# Patient Record
Sex: Female | Born: 1970 | Race: White | Hispanic: No | Marital: Married | Smoking: Never smoker
Health system: Southern US, Community
[De-identification: ages and names within clinical notes are randomized; demographics above are authoritative.]

## PROBLEM LIST (undated history)

## (undated) DIAGNOSIS — T7840XA Allergy, unspecified, initial encounter: Secondary | ICD-10-CM

## (undated) DIAGNOSIS — F419 Anxiety disorder, unspecified: Secondary | ICD-10-CM

## (undated) DIAGNOSIS — Z5189 Encounter for other specified aftercare: Secondary | ICD-10-CM

## (undated) DIAGNOSIS — E785 Hyperlipidemia, unspecified: Secondary | ICD-10-CM

## (undated) HISTORY — PX: TONSILLECTOMY: SUR1361

## (undated) HISTORY — DX: Hyperlipidemia, unspecified: E78.5

## (undated) HISTORY — DX: Allergy, unspecified, initial encounter: T78.40XA

## (undated) HISTORY — DX: Encounter for other specified aftercare: Z51.89

## (undated) HISTORY — DX: Anxiety disorder, unspecified: F41.9

## (undated) HISTORY — PX: EYE SURGERY: SHX253

---
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
## 2006-04-25 HISTORY — PX: CHOLECYSTECTOMY: SHX55

## 2014-01-23 LAB — HM MAMMOGRAPHY

## 2014-01-23 LAB — HM PAP SMEAR

## 2014-04-22 ENCOUNTER — Encounter: Payer: Self-pay | Admitting: Family Medicine

## 2014-04-22 DIAGNOSIS — T7840XA Allergy, unspecified, initial encounter: Secondary | ICD-10-CM | POA: Insufficient documentation

## 2014-04-22 DIAGNOSIS — E785 Hyperlipidemia, unspecified: Secondary | ICD-10-CM | POA: Insufficient documentation

## 2014-05-05 ENCOUNTER — Ambulatory Visit (INDEPENDENT_AMBULATORY_CARE_PROVIDER_SITE_OTHER): Payer: 59 | Admitting: Physician Assistant

## 2014-05-05 ENCOUNTER — Encounter: Payer: Self-pay | Admitting: Family Medicine

## 2014-05-05 ENCOUNTER — Encounter: Payer: Self-pay | Admitting: Physician Assistant

## 2014-05-05 VITALS — BP 122/80 | HR 76 | Temp 98.2°F | Resp 18 | Ht 65.5 in | Wt 165.0 lb

## 2014-05-05 DIAGNOSIS — Z8249 Family history of ischemic heart disease and other diseases of the circulatory system: Secondary | ICD-10-CM

## 2014-05-05 DIAGNOSIS — J309 Allergic rhinitis, unspecified: Secondary | ICD-10-CM | POA: Insufficient documentation

## 2014-05-05 DIAGNOSIS — E785 Hyperlipidemia, unspecified: Secondary | ICD-10-CM

## 2014-05-05 DIAGNOSIS — E559 Vitamin D deficiency, unspecified: Secondary | ICD-10-CM

## 2014-05-05 DIAGNOSIS — J301 Allergic rhinitis due to pollen: Secondary | ICD-10-CM

## 2014-05-05 DIAGNOSIS — Z0000 Encounter for general adult medical examination without abnormal findings: Secondary | ICD-10-CM
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
## 2014-06-16 NOTE — Progress Notes (Signed)
Patient was seen here for complete physical exam 05/05/14. She was to return fasting for full panel of labs. Full panel of labs placed as future order and are now appearing in Overdue Results folder.  Please call patient and remind her to come fasting for labs ASAP-- if she waits longer, her insurance may not cover this as part of her physical..

## 2015-05-08 MED FILL — VIORELE 28 DAY TABLET: 0.15-0.02/0 | 28 days supply | Qty: 28 | Fill #0

## 2015-05-11 DIAGNOSIS — Z01419 Encounter for gynecological examination (general) (routine) without abnormal findings: Secondary | ICD-10-CM | POA: Diagnosis not present

## 2015-05-11 DIAGNOSIS — Z124 Encounter for screening for malignant neoplasm of cervix: Secondary | ICD-10-CM | POA: Diagnosis not present

## 2015-05-11 DIAGNOSIS — Z6828 Body mass index (BMI) 28.0-28.9, adult: Secondary | ICD-10-CM | POA: Diagnosis not present

## 2015-05-11 DIAGNOSIS — Z1231 Encounter for screening mammogram for malignant neoplasm of breast: Secondary | ICD-10-CM | POA: Diagnosis not present

## 2015-05-11 LAB — HM MAMMOGRAPHY

## 2015-05-11 LAB — HM PAP SMEAR

## 2015-05-13 ENCOUNTER — Encounter: Payer: Self-pay | Admitting: Family Medicine

## 2015-06-05 DIAGNOSIS — J019 Acute sinusitis, unspecified: Secondary | ICD-10-CM | POA: Diagnosis not present

## 2015-06-12 MED FILL — VIORELE 28 DAY TABLET: 0.15-0.02/0 | 84 days supply | Qty: 84 | Fill #0

## 2015-09-08 MED FILL — VIORELE 28 DAY TABLET: 0.15-0.02/0 | 84 days supply | Qty: 84 | Fill #1

## 2015-11-25 MED FILL — VIORELE 28 DAY TABLET: 0.15-0.02/0 | 84 days supply | Qty: 84 | Fill #2

## 2016-02-17 MED FILL — VIORELE 28 DAY TABLET: 0.15-0.02/0 | 84 days supply | Qty: 84 | Fill #3

## 2016-05-10 MED FILL — VIORELE 28 DAY TABLET: 0.15-0.02/0 | 28 days supply | Qty: 28 | Fill #0

## 2016-05-13 DIAGNOSIS — Z124 Encounter for screening for malignant neoplasm of cervix: Secondary | ICD-10-CM | POA: Diagnosis not present

## 2016-05-13 DIAGNOSIS — Z01419 Encounter for gynecological examination (general) (routine) without abnormal findings: Secondary | ICD-10-CM | POA: Diagnosis not present

## 2016-05-13 DIAGNOSIS — Z1231 Encounter for screening mammogram for malignant neoplasm of breast: Secondary | ICD-10-CM | POA: Diagnosis not present

## 2016-06-03 DIAGNOSIS — L821 Other seborrheic keratosis: Secondary | ICD-10-CM | POA: Diagnosis not present

## 2016-06-03 DIAGNOSIS — L820 Inflamed seborrheic keratosis: Secondary | ICD-10-CM | POA: Diagnosis not present

## 2016-06-13 MED FILL — VIORELE 28 DAY TABLET: 0.15-0.02/0 | 84 days supply | Qty: 84 | Fill #0

## 2016-09-12 MED FILL — VIORELE 28 DAY TABLET: 0.15-0.02/0 | 84 days supply | Qty: 84 | Fill #1

## 2016-11-28 MED FILL — VIORELE 28 DAY TABLET: 0.15-0.02/0 | 84 days supply | Qty: 84 | Fill #2

## 2017-02-22 MED FILL — VIORELE 28 DAY TABLET: 0.15-0.02/0 | 84 days supply | Qty: 84 | Fill #3

## 2017-05-11 MED FILL — PIMTREA 28 DAY TABLET: 0.15-0.02/0 | 84 days supply | Qty: 84 | Fill #4

## 2017-05-29 DIAGNOSIS — Z124 Encounter for screening for malignant neoplasm of cervix: Secondary | ICD-10-CM | POA: Diagnosis not present

## 2017-05-29 DIAGNOSIS — Z1231 Encounter for screening mammogram for malignant neoplasm of breast: Secondary | ICD-10-CM | POA: Diagnosis not present

## 2017-05-29 DIAGNOSIS — Z01419 Encounter for gynecological examination (general) (routine) without abnormal findings: Secondary | ICD-10-CM | POA: Diagnosis not present

## 2017-05-31 ENCOUNTER — Other Ambulatory Visit: Payer: Self-pay | Admitting: Obstetrics and Gynecology

## 2017-05-31 DIAGNOSIS — R928 Other abnormal and inconclusive findings on diagnostic imaging of breast: Secondary | ICD-10-CM

## 2017-06-02 ENCOUNTER — Other Ambulatory Visit: Payer: Self-pay

## 2017-06-06 ENCOUNTER — Ambulatory Visit
Admission: RE | Admit: 2017-06-06 | Discharge: 2017-06-06 | Disposition: A | Discharge status: Home / Self Care | Payer: 59 | Source: Ambulatory Visit | Attending: Obstetrics and Gynecology | Admitting: Obstetrics and Gynecology

## 2017-06-06 DIAGNOSIS — R922 Inconclusive mammogram: Secondary | ICD-10-CM | POA: Diagnosis not present

## 2017-06-06 DIAGNOSIS — R928 Other abnormal and inconclusive findings on diagnostic imaging of breast: Secondary | ICD-10-CM

## 2017-06-06 DIAGNOSIS — N6489 Other specified disorders of breast: Secondary | ICD-10-CM | POA: Diagnosis not present

## 2017-06-06 LAB — HM MAMMOGRAPHY

## 2017-06-23 ENCOUNTER — Telehealth: Payer: Self-pay

## 2017-06-23 NOTE — Telephone Encounter (Signed)
Letter mailed to [facility] requesting mammogram report

## 2017-08-10 MED FILL — PIMTREA 28 DAY TABLET: 0.15-0.02/0 | 84 days supply | Qty: 84 | Fill #0

## 2017-09-02 ENCOUNTER — Encounter: Payer: Self-pay | Admitting: Nurse Practitioner

## 2017-09-02 ENCOUNTER — Ambulatory Visit (INDEPENDENT_AMBULATORY_CARE_PROVIDER_SITE_OTHER): Payer: Self-pay | Admitting: Nurse Practitioner

## 2017-09-02 VITALS — BP 138/98 | HR 84 | Temp 98.6°F | Resp 16 | Wt 184.0 lb

## 2017-09-02 DIAGNOSIS — L03116 Cellulitis of left lower limb: Secondary | ICD-10-CM

## 2017-09-02 MED ORDER — SULFAMETHOXAZOLE-TRIMETHOPRIM 800-160 MG PO TABS: 1.0000 | ORAL_TABLET | Freq: Two times a day (BID) | ORAL | 0 refills | Status: AC

## 2017-09-02 MED ORDER — HYDROXYZINE HCL 10 MG PO TABS: 10.0000 mg | ORAL_TABLET | Freq: Three times a day (TID) | ORAL | 0 refills | Status: AC | PRN

## 2017-09-02 NOTE — Patient Instructions (Signed)

## 2017-09-02 NOTE — Progress Notes (Addendum)
Subjective:  Chelsea Stewart is a 47 y.o. female who presents for evaluation of wasp sting to her left foot. The patient states she stepped on the wasp approximately 2 days ago.  Since that time, the patient's left foot has been red, swollen and itchy.   The patient denies fever, chills, cold sweats, drainage and abdominal pain.  The patient denies any pain in the left foot, but states that it feels "tight ".  The patient takes Zyrtec daily and has been taking Zyrtec since the insect bite, but states that itching is pretty intense.  Patient denies any history of diabetes.  Review patient's past medical history, current medications and allergies.   Review of Systems  Constitutional: Negative.   HENT: Negative.   Respiratory: Negative.   Skin: Positive for itching.       Left foot swollen, red and warm    Objective:  Physical Exam  Constitutional: She appears well-developed and well-nourished. No distress.  Eyes: Pupils are equal, round, and reactive to light. EOM are normal.  Neck: Normal range of motion. Neck supple.  Cardiovascular: Normal rate, regular rhythm and normal heart sounds.  Pulmonary/Chest: Effort normal and breath sounds normal.  Skin:  Left foot with moderate erythema starting from toes and moving upward. Insect bite appears to be at base of 2nd toe, dorsum of foot warm to palpation.  Neurovascular status intact.       Assessment:  Cellulitis of Left Foot Caused by Wasp Sting      Plan:  1.  Bactrim DS 800-160 mg tablet twice daily for 10 days. 2.  Hydroxyzine 10 mg at bedtime as needed for itching. 3.  Patient instructed to soak foot in Epson salt about 3-4 times daily to help with swelling and inflammation. 4.  Patient instructed to keep foot elevated and to remain off foot as much as possible until swelling improves. 5.  Discussed with patient to monitor the redness in her foot.  If she develops increased redness, streaking, moving up the leg, she should go to the  emergency room as soon as possible. 6.  Patient education provided. 7.  Patient verbalizes understanding and has no questions at time of discharge. Meds ordered this encounter  Medications  . sulfamethoxazole-trimethoprim (BACTRIM DS) 800-160 MG tablet    Sig: Take 1 tablet by mouth 2 (two) times daily for 10 days.    Dispense:  20 tablet    Refill:  0    Order Specific Question:   Supervising Provider    Answer:   Ricard Dillon [[id number]]  . hydrOXYzine (ATARAX/VISTARIL) 10 MG tablet    Sig: Take 1 tablet (10 mg total) by mouth 3 (three) times daily as needed for up to 10 days (itching).    Dispense:  30 tablet    Refill:  0    Order Specific Question:   Supervising Provider    Answer:   Ricard Dillon [phone]

## 2017-09-06 ENCOUNTER — Encounter: Payer: 59 | Admitting: Physician Assistant

## 2017-10-05 ENCOUNTER — Encounter: Payer: Self-pay | Admitting: Physician Assistant

## 2017-10-05 ENCOUNTER — Ambulatory Visit (INDEPENDENT_AMBULATORY_CARE_PROVIDER_SITE_OTHER): Payer: 59 | Admitting: Physician Assistant

## 2017-10-05 ENCOUNTER — Other Ambulatory Visit: Payer: Self-pay

## 2017-10-05 VITALS — BP 130/92 | HR 91 | Temp 97.8°F | Resp 16 | Ht 65.5 in | Wt 182.0 lb

## 2017-10-05 DIAGNOSIS — E785 Hyperlipidemia, unspecified: Secondary | ICD-10-CM | POA: Diagnosis not present

## 2017-10-05 DIAGNOSIS — R1900 Intra-abdominal and pelvic swelling, mass and lump, unspecified site: Secondary | ICD-10-CM | POA: Diagnosis not present

## 2017-10-05 DIAGNOSIS — E559 Vitamin D deficiency, unspecified: Secondary | ICD-10-CM

## 2017-10-05 DIAGNOSIS — Z0000 Encounter for general adult medical examination without abnormal findings: Secondary | ICD-10-CM

## 2017-10-05 DIAGNOSIS — Z8249 Family history of ischemic heart disease and other diseases of the circulatory system: Secondary | ICD-10-CM
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
## 2017-10-06 LAB — COMPLETE METABOLIC PANEL WITH GFR
AG RATIO: 1.4 (calc) (ref 1.0–2.5)
ALT: 15 U/L (ref 6–29)
AST: 22 U/L (ref 10–35)
Albumin: 4.2 g/dL (ref 3.6–5.1)
Alkaline phosphatase (APISO): 70 U/L (ref 33–115)
BUN: 16 mg/dL (ref 7–25)
CALCIUM: 9.7 mg/dL (ref 8.6–10.2)
CO2: 22 mmol/L (ref 20–32)
CREATININE: 0.77 mg/dL (ref 0.50–1.10)
Chloride: 107 mmol/L (ref 98–110)
GFR, EST AFRICAN AMERICAN: 107 mL/min/{1.73_m2} (ref >=60)
GFR, EST NON AFRICAN AMERICAN: 93 mL/min/{1.73_m2} (ref >=60)
Globulin: 3 g/dL (calc) (ref 1.9–3.7)
Glucose, Bld: 97 mg/dL (ref 65–99)
POTASSIUM: 4.8 mmol/L (ref 3.5–5.3)
Sodium: 140 mmol/L (ref 135–146)
TOTAL PROTEIN: 7.2 g/dL (ref 6.1–8.1)
Total Bilirubin: 0.4 mg/dL (ref 0.2–1.2)

## 2017-10-06 LAB — CBC WITH DIFFERENTIAL/PLATELET
BASOS PCT: 1.2 %
Basophils Absolute: 89 cells/uL (ref 0–200)
EOS PCT: 1.8 %
Eosinophils Absolute: 133 cells/uL (ref 15–500)
HCT: 38.1 % (ref 35.0–45.0)
Hemoglobin: 13.1 g/dL (ref 11.7–15.5)
LYMPHS ABS: 2294 {cells}/uL (ref 850–3900)
MCH: 29.5 pg (ref 27.0–33.0)
MCHC: 34.4 g/dL (ref 32.0–36.0)
MCV: 85.8 fL (ref 80.0–100.0)
MPV: 11.2 fL (ref 7.5–12.5)
Monocytes Relative: 7.1 %
Neutro Abs: 4359 cells/uL (ref 1500–7800)
Neutrophils Relative %: 58.9 %
PLATELETS: 353 10*3/uL (ref 140–400)
RBC: 4.44 10*6/uL (ref 3.80–5.10)
RDW: 12.7 % (ref 11.0–15.0)
TOTAL LYMPHOCYTE: 31 %
WBC mixed population: 525 cells/uL (ref 200–950)
WBC: 7.4 10*3/uL (ref 3.8–10.8)

## 2017-10-06 LAB — LIPID PANEL
CHOL/HDL RATIO: 3.7 (calc) (ref <5.0)
Cholesterol: 226 mg/dL — ABNORMAL HIGH (ref <200)
HDL: 61 mg/dL (ref >50)
LDL CHOLESTEROL (CALC): 131 mg/dL — AB
NON-HDL CHOLESTEROL (CALC): 165 mg/dL — AB (ref <130)
TRIGLYCERIDES: 200 mg/dL — AB (ref <150)

## 2017-10-06 LAB — TSH: TSH: 1.08 m[IU]/L

## 2017-10-06 LAB — VITAMIN D 25 HYDROXY (VIT D DEFICIENCY, FRACTURES): Vit D, 25-Hydroxy: 18 ng/mL — ABNORMAL LOW (ref 30–100)

## 2017-10-10 ENCOUNTER — Other Ambulatory Visit: Payer: Self-pay

## 2017-10-10 MED ORDER — VITAMIN D 50 MCG (2000 UT) PO CAPS: 2000.0000 [IU] | ORAL_CAPSULE | Freq: Every day | ORAL | Status: DC

## 2017-10-14 ENCOUNTER — Encounter: Payer: Self-pay | Admitting: Nurse Practitioner

## 2017-10-14 ENCOUNTER — Ambulatory Visit (INDEPENDENT_AMBULATORY_CARE_PROVIDER_SITE_OTHER): Payer: Self-pay | Admitting: Nurse Practitioner

## 2017-10-14 VITALS — BP 120/86 | HR 96 | Temp 98.5°F | Resp 20 | Wt 179.8 lb

## 2017-10-14 DIAGNOSIS — J309 Allergic rhinitis, unspecified: Secondary | ICD-10-CM

## 2017-10-14 MED ORDER — MONTELUKAST SODIUM 10 MG PO TABS: 10.0000 mg | ORAL_TABLET | Freq: Every day | ORAL | 0 refills | Status: DC

## 2017-10-14 MED ORDER — BENZONATATE 100 MG PO CAPS: 100.0000 mg | ORAL_CAPSULE | Freq: Three times a day (TID) | ORAL | 0 refills | Status: AC | PRN

## 2017-10-14 MED ORDER — FLUTICASONE PROPIONATE 50 MCG/ACT NA SUSP
2.0000 | Freq: Every day | NASAL | 0 refills | Status: DC
Start: 2017-10-14 — End: 2019-11-09

## 2017-10-14 NOTE — Progress Notes (Signed)
Subjective:  ALEYSHA MECKLER is a 47 y.o. female who presents for evaluation of possible sinusitis.  Symptoms include fever: no fever, nasal congestion, non productive cough, post nasal drip, sinus pressure, sinus pain, sore throat and swollen glands.  Onset of symptoms was 4 days ago, and has been gradually worsening since that time.  Treatment to date:  antihistamines and decongestants.  High risk factors for influenza complications:  none.  The following portions of the patient's history were reviewed and updated as appropriate:  allergies, current medications and past medical history.  Constitutional: positive for anorexia and fatigue, negative for chills, malaise and sweats Eyes: negative Ears, nose, mouth, throat, and face: positive for nasal congestion, sore throat and bilateral ear fullness/pressure, negative for ear drainage, earaches and hoarseness Respiratory: positive for cough, negative for asthma, hemoptysis, pleurisy/chest pain, sputum, stridor and wheezing Cardiovascular: negative Gastrointestinal: positive for decreased appetite, negative for abdominal pain, diarrhea, nausea and vomiting Neurological: positive for headaches, negative for coordination problems, dizziness, paresthesia, vertigo and weakness Allergic/Immunologic: positive for hay fever Objective:  BP 120/86 (BP Location: Right Arm, Patient Position: Sitting, Cuff Size: Normal)   Pulse 96   Temp 98.5 F (36.9 C) (Oral)   Resp 20   Wt 179 lb 12.8 oz (81.6 kg)   SpO2 99%   BMI 29.47 kg/m  General appearance: alert, cooperative, fatigued and no distress Head: Normocephalic, without obvious abnormality, atraumatic Eyes: conjunctivae/corneas clear. PERRL, EOM's intact. Fundi benign. Ears: abnormal TM right ear - mucoid middle ear fluid and abnormal TM left ear - mucoid middle ear fluid Nose: no discharge, moderate congestion, turbinates swollen, inflamed, mild maxillary sinus tenderness bilateral, mild frontal sinus  tenderness bilateral Throat: lips, mucosa, and tongue normal; teeth and gums normal Lungs: clear to auscultation bilaterally Heart: regular rate and rhythm, S1, S2 normal, no murmur, click, rub or gallop Abdomen: soft, non-tender; bowel sounds normal; no masses,  no organomegaly Pulses: 2+ and symmetric Skin: Skin color, texture, turgor normal. No rashes or lesions Lymph nodes: cervical adenopathy Neurologic: Grossly normal    Assessment:  Allergic Rhinitis    Plan:  Discussed diagnosis and treatment of sinusitis. Discussed the importance of avoiding unnecessary antibiotic therapy. Educational material distributed and questions answered. Suggested symptomatic OTC remedies. Supportive care with appropriate antipyretics and fluids. Nasal saline spray for congestion. Singulair, Flonase and Tessalon Perles per orders. Nasal steroids per orders. Follow up as needed.  Patient instructed to follow up in 3 days if her symptoms do not improve.  Patient verbalizes understanding and has no questions at time of discharge. Meds ordered this encounter  Medications  . montelukast (SINGULAIR) 10 MG tablet    Sig: Take 1 tablet (10 mg total) by mouth at bedtime for 10 days.    Dispense:  30 tablet    Refill:  0    Order Specific Question:   Supervising Provider    Answer:   Ricard Dillon [[id number]]  . fluticasone (FLONASE) 50 MCG/ACT nasal spray    Sig: Place 2 sprays into both nostrils daily for 10 days.    Dispense:  16 g    Refill:  0    Order Specific Question:   Supervising Provider    Answer:   Ricard Dillon [[id number]]  . benzonatate (TESSALON) 100 MG capsule    Sig: Take 1 capsule (100 mg total) by mouth 3 (three) times daily as needed for up to 10 days for cough.    Dispense:  30 capsule  Refill:  0    Order Specific Question:   Supervising Provider    Answer:   Ricard Dillon [phone]

## 2017-10-14 NOTE — Patient Instructions (Signed)

## 2017-10-24 ENCOUNTER — Ambulatory Visit (HOSPITAL_COMMUNITY)
Admission: RE | Admit: 2017-10-24 | Discharge: 2017-10-24 | Disposition: A | Discharge status: Home / Self Care | Payer: 59 | Source: Ambulatory Visit | Attending: Physician Assistant | Admitting: Physician Assistant

## 2017-10-24 DIAGNOSIS — R109 Unspecified abdominal pain: Secondary | ICD-10-CM | POA: Diagnosis not present

## 2017-10-24 DIAGNOSIS — K429 Umbilical hernia without obstruction or gangrene: Secondary | ICD-10-CM | POA: Diagnosis not present

## 2017-10-24 DIAGNOSIS — R1900 Intra-abdominal and pelvic swelling, mass and lump, unspecified site: Secondary | ICD-10-CM | POA: Diagnosis not present

## 2017-10-24 MED ORDER — IOPAMIDOL (ISOVUE-300) INJECTION 61%
INTRAVENOUS | Status: AC
  Filled 2017-10-24: qty 100

## 2017-10-24 MED ORDER — IOPAMIDOL (ISOVUE-300) INJECTION 61%
100.0000 mL | Freq: Once | INTRAVENOUS | Status: AC | PRN
  Administered 2017-10-24: 100 mL via INTRAVENOUS

## 2017-10-25 ENCOUNTER — Other Ambulatory Visit: Payer: Self-pay

## 2017-10-25 DIAGNOSIS — K429 Umbilical hernia without obstruction or gangrene: Secondary | ICD-10-CM

## 2017-10-30 MED FILL — PIMTREA 28 DAY TABLET: 0.15-0.02/0 | 84 days supply | Qty: 84 | Fill #1

## 2017-11-13 DIAGNOSIS — K432 Incisional hernia without obstruction or gangrene: Secondary | ICD-10-CM | POA: Diagnosis not present

## 2018-01-17 MED FILL — PIMTREA 28 DAY TABLET: 0.15-0.02/0 | 84 days supply | Qty: 84 | Fill #2

## 2018-03-29 ENCOUNTER — Encounter (HOSPITAL_COMMUNITY): Payer: Self-pay | Admitting: Emergency Medicine

## 2018-03-29 ENCOUNTER — Other Ambulatory Visit: Payer: Self-pay

## 2018-03-29 ENCOUNTER — Ambulatory Visit (HOSPITAL_COMMUNITY)
Admission: EM | Admit: 2018-03-29 | Discharge: 2018-03-29 | Disposition: A | Discharge status: Home / Self Care | Payer: 59 | Attending: Family Medicine | Admitting: Family Medicine

## 2018-03-29 DIAGNOSIS — M542 Cervicalgia: Secondary | ICD-10-CM | POA: Diagnosis not present

## 2018-03-29 MED ORDER — IBUPROFEN 600 MG PO TABS: 600.0000 mg | ORAL_TABLET | Freq: Four times a day (QID) | ORAL | 0 refills | Status: DC | PRN

## 2018-03-29 NOTE — Discharge Instructions (Signed)
Please follow up with primary care for further evaluation of this area, may need ultrasound of thyroid In the meantime please take tylneol/ibuprofen Warm compresses  Sore Throat  Please continue Tylenol or Ibuprofen for fever and pain. May try salt water gargles, cepacol lozenges, throat spray, or OTC cold relief medicine for throat discomfort. If you also have congestion take a daily anti-histamine like Zyrtec, Claritin, and a oral decongestant to help with post nasal drip that may be irritating your throat.   Stay hydrated and drink plenty of fluids to keep your throat coated relieve irritation.

## 2018-03-29 NOTE — ED Triage Notes (Signed)
PT reports a slight sore throat last night. PT has a palpable swollen area on right side of throat. PT reports a headache today.
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
## 2018-03-30 ENCOUNTER — Ambulatory Visit: Payer: 59 | Admitting: Family Medicine

## 2018-04-03 ENCOUNTER — Ambulatory Visit (INDEPENDENT_AMBULATORY_CARE_PROVIDER_SITE_OTHER): Payer: 59 | Admitting: Family Medicine

## 2018-04-03 ENCOUNTER — Encounter: Payer: Self-pay | Admitting: Family Medicine

## 2018-04-03 VITALS — BP 114/80 | HR 79 | Temp 98.7°F | Ht 65.5 in | Wt 178.2 lb

## 2018-04-03 DIAGNOSIS — R591 Generalized enlarged lymph nodes: Secondary | ICD-10-CM | POA: Diagnosis not present

## 2018-04-03 NOTE — Progress Notes (Signed)
Patient ID: Chelsea Stewart, female    DOB: [birth year], 47 y.o.   MRN: [id number]  PCP: Orlena Sheldon, PA-C  Chief Complaint  Patient presents with  . Neck Mass    Patient in today for a lump in a neck onset last wednesday. States it has went down and is no longer painful    Subjective:   Chelsea Stewart is a 47 y.o. female, presents to clinic with CC of right anterior neck tender bump that she noticed last Wednesday night (5 days ago).  It was moderately painful, described as sore, also felt like there was a "lump inside" which was worse and more noticeable with swallowing.  It was tender to the touch.  It has gradually improved but not resolved, the tenderness is better but the size of the nodule is still there and she still feels a "firmness" when she is swallowing.  She denies any recent illness, denies fever.  She denies any change to the quality of her voice, denies dysphasia of liquids or solids.  She has no history of thyroid disease.  She had a family history of abnormal thyroid with her paternal grandmother but no other family history.  She does have seasonal allergies which she notes are slightly worse than normal she is taking Zyrtec and Flonase but has not been taking Singulair.  Patient Active Problem List   Diagnosis Date Noted  . Vitamin D deficiency 05/05/2014  . Family history of premature coronary artery disease 05/05/2014  . Allergic rhinitis 05/05/2014  . Hyperlipidemia      Prior to Admission medications   Medication Sig Start Date End Date Taking? Authorizing Provider  cetirizine (ZYRTEC) 10 MG tablet Take 10 mg by mouth at bedtime.   Yes [provider]  Cholecalciferol (VITAMIN D) 2000 units CAPS Take 1 capsule (2,000 Units total) by mouth daily. 10/10/17  Yes Orlena Sheldon, PA-C  desogestrel-ethinyl estradiol (KARIVA,AZURETTE,MIRCETTE) 0.15-0.02/0.01 MG (21/5) tablet Take 1 tablet by mouth daily.   Yes [provider]  fluticasone (FLONASE) 50  MCG/ACT nasal spray Place 2 sprays into both nostrils daily for 10 days. 10/14/17 10/24/17  Kara Dies, NP     Allergies  Allergen Reactions  . Erythromycin Nausea Only     Family History  Problem Relation Age of Onset  . Arthritis Mother   . Hyperlipidemia Mother   . Heart disease Mother 63       CABG--1st Dxed CAD  . Hypertension Mother   . Cancer Paternal Grandfather 58       Lung Cancer--Smoker     Social History   Socioeconomic History  . Marital status: Married    Spouse name: Not on file  . Number of children: Not on file  . Years of education: Not on file  . Highest education level: Not on file  Occupational History  . Not on file  Social Needs  . Financial resource strain: Not on file  . Food insecurity:    Worry: Not on file    Inability: Not on file  . Transportation needs:    Medical: Not on file    Non-medical: Not on file  Tobacco Use  . Smoking status: Never Smoker  . Smokeless tobacco: Never Used  Substance and Sexual Activity  . Alcohol use: No  . Drug use: No  . Sexual activity: Yes    Birth control/protection: Pill  Lifestyle  . Physical activity:    Days per week: Not  on file    Minutes per session: Not on file  . Stress: Not on file  Relationships  . Social connections:    Talks on phone: Not on file    Gets together: Not on file    Attends religious service: Not on file    Active member of club or organization: Not on file    Attends meetings of clubs or organizations: Not on file    Relationship status: Not on file  . Intimate partner violence:    Fear of current or ex partner: Not on file    Emotionally abused: Not on file    Physically abused: Not on file    Forced sexual activity: Not on file  Other Topics Concern  . 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 1 Son, 1  Daughter, ages 45 y/o and 50 y/o.    Exercises on Treadmill 30 minutes every day.      Review of Systems  Constitutional: Negative.   HENT: Negative.   Eyes: Negative.   Respiratory: Negative.   Cardiovascular: Negative.   Gastrointestinal: Negative.   Endocrine: Negative.   Genitourinary: Negative.   Musculoskeletal: Negative.   Skin: Negative.   Allergic/Immunologic: Negative.   Neurological: Negative.   Hematological: Negative.   Psychiatric/Behavioral: Negative.   All other systems reviewed and are negative.      Objective:    Vitals:   04/03/18 0807  BP: 114/80  Pulse: 79  Temp: 98.7 F (37.1 C)  TempSrc: Oral  SpO2: 98%  Weight: 178 lb 4 oz (80.9 kg)  Height: 5' 5.5" (1.664 m)      Physical Exam  Constitutional: She appears well-developed and well-nourished.  Non-toxic appearance. She does not have a sickly appearance. She does not appear ill. No distress.  HENT:  Head: Normocephalic and atraumatic.  Right Ear: Hearing, tympanic membrane, external ear and ear canal normal.  Left Ear: Hearing, tympanic membrane, external ear and ear canal normal.  Nose: Mucosal edema (with mild erythema) and rhinorrhea present. Right sinus exhibits no maxillary sinus tenderness and no frontal sinus tenderness. Left sinus exhibits no maxillary sinus tenderness and no frontal sinus tenderness.  Mouth/Throat: Uvula is midline and mucous membranes are normal. Mucous membranes are not pale, not dry and not cyanotic. No uvula swelling. Posterior oropharyngeal erythema present. No oropharyngeal exudate, posterior oropharyngeal edema or tonsillar abscesses. Tonsils are 0 on the right. Tonsils are 0 on the left. No tonsillar exudate.  Eyes: Pupils are equal, round, and reactive to light. Conjunctivae are normal. Right eye exhibits no discharge. Left eye exhibits no discharge.  Neck: Trachea normal, normal range of motion and phonation normal. Neck supple. No tracheal tenderness present.  Carotid bruit is not present. No neck rigidity. No tracheal deviation, no edema and no erythema present. No thyromegaly present.    Cardiovascular: Normal rate, regular rhythm, normal heart sounds and intact distal pulses.  Pulmonary/Chest: Effort normal. No stridor. No respiratory distress. She has no wheezes. She has no rhonchi. She has no rales.  Abdominal: Soft. Bowel sounds are normal. She exhibits no distension.  Musculoskeletal: Normal range of motion.  Lymphadenopathy:       Head (right side): Submandibular adenopathy present. No submental, no tonsillar, no preauricular and no posterior auricular adenopathy present.  Head (left side): Submandibular adenopathy present. No submental, no tonsillar, no preauricular and no posterior auricular adenopathy present.    She has cervical adenopathy.       Right cervical: Superficial cervical (low anterior and medial 1x2cm mildly tender ) adenopathy present.    She has no axillary adenopathy.  Neurological: She is alert. She exhibits normal muscle tone. Coordination normal.  Skin: Skin is warm and dry. No rash noted. She is not diaphoretic. No pallor.  Psychiatric: She has a normal mood and affect. Her behavior is normal.  Nursing note and vitals reviewed.         Assessment & Plan:      ICD-10-CM   1. Lymphadenopathy of head and neck M41.5 COMPLETE METABOLIC PANEL WITH GFR    TSH    T3, Free    T4, Free    CBC with Differential/Platelet    US Soft Tissue Head/Neck    Long firm nodule palpated low anterior right neck, cervical lymph node versus thyroid nodule, did not feel any thyromegaly and I am more suspicious that it may be reactive lymph node.  Pt has some allergic rhinitis that appears irritated may be some URI has caused a lymph node.  Is reassuring that it is beginning to resolve.  Patient would like the screening lab work, will see if it takes a few weeks to schedule the ultrasound, if so she would like to proceed with that  and cancel if there is any resolution of her lymph node.  I only feel that I should order the ultrasound because of the longer length of the nodules which I have not felt very many times with cervical lymphadenopathy.  Feel we can wait for about 2 weeks and recheck, so once staff has a chance to call regarding scan, if they want to do it right away pt would prefer to wait.   Delsa Grana, PA-C 04/03/18 8:11 AM

## 2018-04-03 NOTE — Patient Instructions (Signed)
Will get some basic screening labs and check thyroid labs.  You will be called to schedule the ultrasound  Since you have a good amount of nasal discharge and post nasal drip, I would increase any allergy control treatment you can do and see if that helps.  Continue singulair, take a daily antihistamine and use flonase or nasonex.   Please let me know if you have a fever or any new symptoms.  I think most likely this is a reactive lymph node low in the cervical lymph node chain.

## 2018-04-04 LAB — CBC WITH DIFFERENTIAL/PLATELET
Basophils Absolute: 81 cells/uL (ref 0–200)
Basophils Relative: 1.1 %
EOS PCT: 3.2 %
Eosinophils Absolute: 237 cells/uL (ref 15–500)
HCT: 37.5 % (ref 35.0–45.0)
Hemoglobin: 12.8 g/dL (ref 11.7–15.5)
Lymphs Abs: 2442 cells/uL (ref 850–3900)
MCH: 30.1 pg (ref 27.0–33.0)
MCHC: 34.1 g/dL (ref 32.0–36.0)
MCV: 88.2 fL (ref 80.0–100.0)
MPV: 11.2 fL (ref 7.5–12.5)
Monocytes Relative: 7 %
Neutro Abs: 4122 cells/uL (ref 1500–7800)
Neutrophils Relative %: 55.7 %
Platelets: 403 10*3/uL — ABNORMAL HIGH (ref 140–400)
RBC: 4.25 10*6/uL (ref 3.80–5.10)
RDW: 12.4 % (ref 11.0–15.0)
Total Lymphocyte: 33 %
WBC mixed population: 518 cells/uL (ref 200–950)
WBC: 7.4 10*3/uL (ref 3.8–10.8)

## 2018-04-04 LAB — COMPLETE METABOLIC PANEL WITH GFR
AG Ratio: 1.4 (calc) (ref 1.0–2.5)
ALT: 14 U/L (ref 6–29)
AST: 18 U/L (ref 10–35)
Albumin: 4.1 g/dL (ref 3.6–5.1)
Alkaline phosphatase (APISO): 64 U/L (ref 33–115)
BUN: 14 mg/dL (ref 7–25)
CO2: 24 mmol/L (ref 20–32)
CREATININE: 0.69 mg/dL (ref 0.50–1.10)
Calcium: 9.6 mg/dL (ref 8.6–10.2)
Chloride: 105 mmol/L (ref 98–110)
GFR, EST AFRICAN AMERICAN: 120 mL/min/{1.73_m2} (ref >=60)
GFR, EST NON AFRICAN AMERICAN: 104 mL/min/{1.73_m2} (ref >=60)
GLOBULIN: 2.9 g/dL (ref 1.9–3.7)
Glucose, Bld: 93 mg/dL (ref 65–99)
Potassium: 4.9 mmol/L (ref 3.5–5.3)
SODIUM: 138 mmol/L (ref 135–146)
TOTAL PROTEIN: 7 g/dL (ref 6.1–8.1)
Total Bilirubin: 0.3 mg/dL (ref 0.2–1.2)

## 2018-04-04 LAB — T4, FREE: Free T4: 1 ng/dL (ref 0.8–1.8)

## 2018-04-04 LAB — TSH: TSH: 1.28 m[IU]/L

## 2018-04-04 LAB — T3, FREE: T3, Free: 3.2 pg/mL (ref 2.3–4.2)

## 2018-04-06 ENCOUNTER — Encounter (HOSPITAL_COMMUNITY): Payer: Self-pay

## 2018-04-06 ENCOUNTER — Ambulatory Visit (HOSPITAL_COMMUNITY): Payer: 59

## 2018-04-19 MED FILL — PIMTREA 28 DAY TABLET: 0.15-0.02/0 | 84 days supply | Qty: 84 | Fill #3

## 2018-04-25 HISTORY — PX: EPIBLEPHERON REPAIR WITH TEAR DUCT PROBING: SHX5617

## 2018-05-28 ENCOUNTER — Other Ambulatory Visit: Payer: Self-pay | Admitting: Gynecologic Oncology

## 2018-05-28 DIAGNOSIS — Z20828 Contact with and (suspected) exposure to other viral communicable diseases: Secondary | ICD-10-CM | POA: Insufficient documentation

## 2018-05-28 MED ORDER — OSELTAMIVIR PHOSPHATE 75 MG PO CAPS: 75.0000 mg | ORAL_CAPSULE | Freq: Two times a day (BID) | ORAL | 1 refills | Status: DC

## 2018-05-28 MED FILL — OSELTAMIVIR PHOSPHATE 75 MG: 75 | 7 days supply | Qty: 14 | Fill #0

## 2018-05-28 NOTE — Progress Notes (Signed)
Influenza A exposure.  Recommend 1 week course of tamiflu

## 2018-06-28 DIAGNOSIS — H53032 Strabismic amblyopia, left eye: Secondary | ICD-10-CM | POA: Diagnosis not present

## 2018-06-28 DIAGNOSIS — H5000 Unspecified esotropia: Secondary | ICD-10-CM | POA: Diagnosis not present

## 2018-06-28 DIAGNOSIS — H524 Presbyopia: Secondary | ICD-10-CM | POA: Diagnosis not present

## 2018-06-28 DIAGNOSIS — H04203 Unspecified epiphora, bilateral lacrimal glands: Secondary | ICD-10-CM | POA: Diagnosis not present

## 2018-06-28 DIAGNOSIS — H04123 Dry eye syndrome of bilateral lacrimal glands: Secondary | ICD-10-CM | POA: Diagnosis not present

## 2018-07-10 MED FILL — PIMTREA 28 DAY TABLET: 0.15-0.02/0 | 84 days supply | Qty: 84 | Fill #0

## 2018-07-12 ENCOUNTER — Other Ambulatory Visit: Payer: Self-pay | Admitting: Gynecologic Oncology

## 2018-07-12 MED FILL — AMOX-CLAV 875-125 MG TABLET: 875-125 | 14 days supply | Qty: 28 | Fill #0

## 2018-10-01 DIAGNOSIS — Z124 Encounter for screening for malignant neoplasm of cervix: Secondary | ICD-10-CM | POA: Diagnosis not present

## 2018-10-01 DIAGNOSIS — R87610 Atypical squamous cells of undetermined significance on cytologic smear of cervix (ASC-US): Secondary | ICD-10-CM | POA: Diagnosis not present

## 2018-10-01 DIAGNOSIS — Z01419 Encounter for gynecological examination (general) (routine) without abnormal findings: Secondary | ICD-10-CM | POA: Diagnosis not present

## 2018-10-01 DIAGNOSIS — Z1231 Encounter for screening mammogram for malignant neoplasm of breast: Secondary | ICD-10-CM | POA: Diagnosis not present

## 2018-10-01 LAB — HM MAMMOGRAPHY

## 2018-10-01 MED FILL — PIMTREA 28 DAY TABLET: 0.15-0.02/0 | 84 days supply | Qty: 84 | Fill #0

## 2018-10-16 DIAGNOSIS — L820 Inflamed seborrheic keratosis: Secondary | ICD-10-CM | POA: Diagnosis not present

## 2018-10-23 NOTE — Progress Notes (Signed)
Pt appt.cancelled

## 2018-12-25 MED FILL — PIMTREA 28 DAY TABLET: 0.15-0.02/0 | 84 days supply | Qty: 84 | Fill #1

## 2019-02-05 DIAGNOSIS — H04211 Epiphora due to excess lacrimation, right lacrimal gland: Secondary | ICD-10-CM | POA: Diagnosis not present

## 2019-02-20 DIAGNOSIS — H04129 Dry eye syndrome of unspecified lacrimal gland: Secondary | ICD-10-CM | POA: Diagnosis not present

## 2019-02-20 DIAGNOSIS — H04551 Acquired stenosis of right nasolacrimal duct: Secondary | ICD-10-CM | POA: Diagnosis not present

## 2019-02-20 DIAGNOSIS — L814 Other melanin hyperpigmentation: Secondary | ICD-10-CM | POA: Diagnosis not present

## 2019-02-20 DIAGNOSIS — H53002 Unspecified amblyopia, left eye: Secondary | ICD-10-CM | POA: Diagnosis not present

## 2019-02-20 DIAGNOSIS — H04221 Epiphora due to insufficient drainage, right lacrimal gland: Secondary | ICD-10-CM | POA: Diagnosis not present

## 2019-03-19 MED FILL — PIMTREA 28 DAY TABLET: 0.15-0.02/0 | 84 days supply | Qty: 84 | Fill #2

## 2019-03-26 MED FILL — FLUTICASONE PROP 50 MCG SPR: 50 | 30 days supply | Qty: 16 | Fill #0

## 2019-03-26 MED FILL — NEO/POLYMYXIN/DEXAMETH DROP: 3.5-10000-0 | 30 days supply | Qty: 5 | Fill #0

## 2019-03-28 DIAGNOSIS — Z01818 Encounter for other preprocedural examination: Secondary | ICD-10-CM | POA: Diagnosis not present

## 2019-04-01 DIAGNOSIS — H04551 Acquired stenosis of right nasolacrimal duct: Secondary | ICD-10-CM | POA: Diagnosis not present

## 2019-04-01 DIAGNOSIS — L814 Other melanin hyperpigmentation: Secondary | ICD-10-CM | POA: Diagnosis not present

## 2019-04-01 DIAGNOSIS — H04411 Chronic dacryocystitis of right lacrimal passage: Secondary | ICD-10-CM | POA: Diagnosis not present

## 2019-04-01 DIAGNOSIS — H53002 Unspecified amblyopia, left eye: Secondary | ICD-10-CM | POA: Diagnosis not present

## 2019-04-01 DIAGNOSIS — H04221 Epiphora due to insufficient drainage, right lacrimal gland: Secondary | ICD-10-CM | POA: Diagnosis not present

## 2019-04-01 DIAGNOSIS — H04561 Stenosis of right lacrimal punctum: Secondary | ICD-10-CM | POA: Diagnosis not present

## 2019-04-01 DIAGNOSIS — H04129 Dry eye syndrome of unspecified lacrimal gland: Secondary | ICD-10-CM | POA: Diagnosis not present

## 2019-04-01 DIAGNOSIS — H04021 Chronic dacryoadenitis, right lacrimal gland: Secondary | ICD-10-CM | POA: Diagnosis not present

## 2019-04-16 DIAGNOSIS — H04221 Epiphora due to insufficient drainage, right lacrimal gland: Secondary | ICD-10-CM | POA: Diagnosis not present

## 2019-04-16 DIAGNOSIS — H04559 Acquired stenosis of unspecified nasolacrimal duct: Secondary | ICD-10-CM | POA: Diagnosis not present

## 2019-04-16 DIAGNOSIS — H53002 Unspecified amblyopia, left eye: Secondary | ICD-10-CM | POA: Diagnosis not present

## 2019-04-16 DIAGNOSIS — H04129 Dry eye syndrome of unspecified lacrimal gland: Secondary | ICD-10-CM | POA: Diagnosis not present

## 2019-05-24 DIAGNOSIS — H04221 Epiphora due to insufficient drainage, right lacrimal gland: Secondary | ICD-10-CM | POA: Diagnosis not present

## 2019-05-24 DIAGNOSIS — H04551 Acquired stenosis of right nasolacrimal duct: Secondary | ICD-10-CM | POA: Diagnosis not present

## 2019-06-11 MED FILL — PIMTREA 28 DAY TABLET: 0.15-0.02/0 | 84 days supply | Qty: 84 | Fill #3

## 2019-08-29 MED FILL — PIMTREA 28 DAY TABLET: 0.15-0.02/0 | 84 days supply | Qty: 84 | Fill #4

## 2019-10-10 ENCOUNTER — Other Ambulatory Visit (HOSPITAL_COMMUNITY): Payer: Self-pay | Admitting: Obstetrics and Gynecology

## 2019-10-10 DIAGNOSIS — Z1231 Encounter for screening mammogram for malignant neoplasm of breast: Secondary | ICD-10-CM | POA: Diagnosis not present

## 2019-10-10 DIAGNOSIS — Z01419 Encounter for gynecological examination (general) (routine) without abnormal findings: Secondary | ICD-10-CM | POA: Diagnosis not present

## 2019-10-14 ENCOUNTER — Other Ambulatory Visit: Payer: Self-pay | Admitting: Obstetrics and Gynecology

## 2019-10-14 DIAGNOSIS — R928 Other abnormal and inconclusive findings on diagnostic imaging of breast: Secondary | ICD-10-CM

## 2019-10-24 ENCOUNTER — Ambulatory Visit
Admission: RE | Admit: 2019-10-24 | Discharge: 2019-10-24 | Disposition: A | Discharge status: Home / Self Care | Payer: 59 | Source: Ambulatory Visit | Attending: Obstetrics and Gynecology | Admitting: Obstetrics and Gynecology

## 2019-10-24 ENCOUNTER — Other Ambulatory Visit: Payer: Self-pay

## 2019-10-24 DIAGNOSIS — N6489 Other specified disorders of breast: Secondary | ICD-10-CM | POA: Diagnosis not present

## 2019-10-24 DIAGNOSIS — R922 Inconclusive mammogram: Secondary | ICD-10-CM | POA: Diagnosis not present

## 2019-10-24 DIAGNOSIS — R928 Other abnormal and inconclusive findings on diagnostic imaging of breast: Secondary | ICD-10-CM

## 2019-10-25 ENCOUNTER — Other Ambulatory Visit: Payer: 59

## 2019-11-09 ENCOUNTER — Ambulatory Visit (HOSPITAL_COMMUNITY)
Admission: EM | Admit: 2019-11-09 | Discharge: 2019-11-09 | Disposition: A | Discharge status: Home / Self Care | Payer: 59 | Attending: Physician Assistant | Admitting: Physician Assistant

## 2019-11-09 ENCOUNTER — Encounter (HOSPITAL_COMMUNITY): Payer: Self-pay | Admitting: Emergency Medicine

## 2019-11-09 ENCOUNTER — Other Ambulatory Visit: Payer: Self-pay

## 2019-11-09 DIAGNOSIS — E559 Vitamin D deficiency, unspecified: Secondary | ICD-10-CM | POA: Diagnosis not present

## 2019-11-09 DIAGNOSIS — Z20822 Contact with and (suspected) exposure to covid-19: Secondary | ICD-10-CM | POA: Diagnosis not present

## 2019-11-09 DIAGNOSIS — Z79899 Other long term (current) drug therapy: Secondary | ICD-10-CM | POA: Insufficient documentation

## 2019-11-09 DIAGNOSIS — J069 Acute upper respiratory infection, unspecified: Secondary | ICD-10-CM | POA: Diagnosis not present

## 2019-11-09 DIAGNOSIS — J029 Acute pharyngitis, unspecified: Secondary | ICD-10-CM | POA: Insufficient documentation

## 2019-11-09 DIAGNOSIS — R05 Cough: Secondary | ICD-10-CM | POA: Diagnosis not present

## 2019-11-09 DIAGNOSIS — E785 Hyperlipidemia, unspecified: Secondary | ICD-10-CM | POA: Diagnosis not present

## 2019-11-09 LAB — SARS CORONAVIRUS 2 (TAT 6-24 HRS): SARS Coronavirus 2: NEGATIVE

## 2019-11-09 MED ORDER — FLUTICASONE PROPIONATE 50 MCG/ACT NA SUSP
2.0000 | Freq: Every day | NASAL | 0 refills | Status: DC
Start: 2019-11-09 — End: 2020-07-01

## 2019-11-09 MED ORDER — CEPACOL SORE THROAT 5.4 MG MT LOZG
1.0000 | LOZENGE | OROMUCOSAL | 0 refills | Status: DC | PRN
Start: 2019-11-09 — End: 2020-07-01

## 2019-11-09 MED ORDER — BENZONATATE 100 MG PO CAPS
100.0000 mg | ORAL_CAPSULE | Freq: Three times a day (TID) | ORAL | 0 refills | Status: AC | PRN
Start: 2019-11-09 — End: 2019-11-19

## 2019-11-09 MED ORDER — ACETAMINOPHEN 325 MG PO TABS
650.0000 mg | ORAL_TABLET | Freq: Four times a day (QID) | ORAL | 0 refills | Status: DC | PRN
Start: 2019-11-09 — End: 2020-07-01

## 2019-11-09 MED ORDER — GUAIFENESIN ER 600 MG PO TB12
600.0000 mg | ORAL_TABLET | Freq: Two times a day (BID) | ORAL | 0 refills | Status: AC
Start: 2019-11-09 — End: 2019-11-16

## 2019-11-09 NOTE — ED Triage Notes (Signed)
on Tuesday started having a non-productive cough, sore throat, burning in chest and says when coughing she sounds congested in chest  Denies fever  Patient has been taking otc cough medicine and nyquil  Sleeping is interrupted by cough and sore throat
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
## 2019-11-09 NOTE — ED Notes (Signed)
covid test in lab

## 2019-11-09 NOTE — Discharge Instructions (Addendum)
I believe you have a viral illness.  Take Tessalon as needed for cough up to every 8 hours Mucinex twice a day for up to 7 days Use Flonase twice a day for 10 days, consider daily use after this Continue Zyrtec Use Cepacol for sore throat relief  Drink plenty of water.  If symptoms are acutely worsening such that you have shortness of breath, high fever and productive cough return.  If not improving at day 10 please follow-up with your primary care or return this clinic.  If your Covid-19 test is positive, you will receive a phone call from [facility] regarding your results. Negative test results are not called. Both positive and negative results area always visible on MyChart. If you do not have a MyChart account, sign up instructions are in your discharge papers.   Persons who are directed to care for themselves at home may discontinue isolation under the following conditions:   At least 10 days have passed since symptom onset and  At least 24 hours have passed without running a fever (this means without the use of fever-reducing medications) and  Other symptoms have improved.  Persons infected with COVID-19 who never develop symptoms may discontinue isolation and other precautions 10 days after the date of their first positive COVID-19 test.

## 2019-11-21 MED FILL — PIMTREA 28 DAY TABLET: 0.15-0.02/0 | 84 days supply | Qty: 84 | Fill #0

## 2020-02-03 DIAGNOSIS — L918 Other hypertrophic disorders of the skin: Secondary | ICD-10-CM | POA: Diagnosis not present

## 2020-02-03 DIAGNOSIS — L820 Inflamed seborrheic keratosis: Secondary | ICD-10-CM | POA: Diagnosis not present

## 2020-02-03 DIAGNOSIS — Z411 Encounter for cosmetic surgery: Secondary | ICD-10-CM | POA: Diagnosis not present

## 2020-02-09 MED FILL — PIMTREA 28 DAY TABLET: 0.15-0.02/0 | 84 days supply | Qty: 84 | Fill #1

## 2020-02-19 MED FILL — PIMTREA 28 DAY TABLET: 0.15-0.02/0 | 84 days supply | Qty: 84 | Fill #1

## 2020-05-11 ENCOUNTER — Telehealth: Payer: Self-pay

## 2020-05-11 NOTE — Telephone Encounter (Signed)
lvm to reschedule

## 2020-05-15 ENCOUNTER — Ambulatory Visit: Payer: 59 | Admitting: Nurse Practitioner

## 2020-05-18 MED FILL — PIMTREA 28 DAY TABLET: 0.15-0.02/0 | 84 days supply | Qty: 84 | Fill #2

## 2020-06-10 ENCOUNTER — Ambulatory Visit: Payer: 59 | Admitting: Nurse Practitioner

## 2020-07-01 ENCOUNTER — Encounter: Payer: Self-pay | Admitting: Nurse Practitioner

## 2020-07-01 ENCOUNTER — Other Ambulatory Visit: Payer: Self-pay

## 2020-07-01 ENCOUNTER — Ambulatory Visit: Payer: 59 | Admitting: Nurse Practitioner

## 2020-07-01 VITALS — BP 118/64 | HR 93 | Temp 98.7°F | Ht 65.4 in | Wt 179.8 lb

## 2020-07-01 DIAGNOSIS — Z6829 Body mass index (BMI) 29.0-29.9, adult: Secondary | ICD-10-CM

## 2020-07-01 DIAGNOSIS — Z0000 Encounter for general adult medical examination without abnormal findings: Secondary | ICD-10-CM

## 2020-07-01 DIAGNOSIS — E559 Vitamin D deficiency, unspecified: Secondary | ICD-10-CM | POA: Diagnosis not present

## 2020-07-01 DIAGNOSIS — E663 Overweight: Secondary | ICD-10-CM

## 2020-07-01 DIAGNOSIS — Z7689 Persons encountering health services in other specified circumstances: Secondary | ICD-10-CM | POA: Diagnosis not present

## 2020-07-01 DIAGNOSIS — Z1159 Encounter for screening for other viral diseases: Secondary | ICD-10-CM

## 2020-07-01 DIAGNOSIS — Z13220 Encounter for screening for lipoid disorders: Secondary | ICD-10-CM

## 2020-07-01 DIAGNOSIS — Z114 Encounter for screening for human immunodeficiency virus [HIV]: Secondary | ICD-10-CM

## 2020-07-01 NOTE — Progress Notes (Signed)
I,Tianna Badgett,acting as a Education administrator for [employer], NP.,have documented all relevant documentation on the behalf of [employer], NP,as directed by  Bary Castilla, NP while in the presence of Bary Castilla, NP.  This visit occurred during the SARS-CoV-2 public health emergency.  Safety protocols were in place, including screening questions prior to the visit, additional usage of staff PPE, and extensive cleaning of exam room while observing appropriate contact time as indicated for disinfecting solutions.  Subjective:     Patient ID: Chelsea Stewart , female    DOB: 12/16/1970 , 50 y.o.   MRN: [id number]   Chief Complaint  Patient presents with  . Establish Care    HPI  Patient is here to establish care. She has no concern today. She would like a physical exam today. She is followed by Dr Harrington Challenger at [facility] for her GYN care. She saw a PCP a couple of years ago. She had a paps smear in Aug. And she takes [facility]. Her husband had a heart attack last month so that scared her. She is Art therapist at [employer].   Diet: She has been eating fish and chicken  Exercise: she does treadmill 30 min. Daily   Drinks occasionally on the weekend.  She does not smoke. She got a mammogram in Aug of 2021.   Vaccinated with COVID and flu shot.  She sees the eye doc yearly Every 6 month of dentist.     Past Medical History:  Diagnosis Date  . Allergy    seasonal  . Blood transfusion without reported diagnosis    1988  . Hyperlipidemia      Family History  Problem Relation Age of Onset  . Arthritis Mother   . Hyperlipidemia Mother   . Heart disease Mother 38       CABG--1st Dxed CAD  . Hypertension Mother   . Cancer Father   . Parkinson's disease Father   . Cancer Paternal Grandfather 86       Lung Cancer--Smoker     Current Outpatient Medications:  .  acetaminophen (TYLENOL) 325 MG tablet, Take 2 tablets (650 mg total) by mouth every 6 (six) hours as  needed., Disp: 30 tablet, Rfl: 0 .  cetirizine (ZYRTEC) 10 MG tablet, Take 10 mg by mouth at bedtime., Disp: , Rfl:  .  desogestrel-ethinyl estradiol (KARIVA,AZURETTE,MIRCETTE) 0.15-0.02/0.01 MG (21/5) tablet, Take 1 tablet by mouth daily., Disp: , Rfl:  .  fluticasone (FLONASE) 50 MCG/ACT nasal spray, Place 2 sprays into both nostrils daily for 10 days., Disp: 1 g, Rfl: 0   Allergies  Allergen Reactions  . Erythromycin Nausea Only     Review of Systems  Constitutional: Negative.  Negative for fatigue and fever.  HENT: Negative.  Negative for ear discharge, ear pain and sinus pain.   Eyes: Negative.   Respiratory: Negative.  Negative for cough, shortness of breath and wheezing.   Cardiovascular: Negative.  Negative for leg swelling.  Gastrointestinal: Negative.   Endocrine: Negative.  Negative for polydipsia, polyphagia and polyuria.  Genitourinary: Negative.  Negative for flank pain.  Musculoskeletal: Negative.  Negative for back pain and myalgias.  Skin: Negative.   Allergic/Immunologic: Negative.   Neurological: Negative.  Negative for dizziness, light-headedness and numbness.  Hematological: Negative.   Psychiatric/Behavioral: Negative.  Negative for hallucinations.  All other systems reviewed and are negative.    Today's Vitals   07/01/20 0825  BP: 118/64  Pulse: 93  Temp: 98.7 F (37.1 C)  TempSrc: Oral  Weight: 179 lb 12.8 oz (81.6 kg)  Height: 5' 5.4" (1.661 m)   Body mass index is 29.56 kg/m.  Wt Readings from Last 3 Encounters:  07/01/20 179 lb 12.8 oz (81.6 kg)  04/03/18 178 lb 4 oz (80.9 kg)  10/14/17 179 lb 12.8 oz (81.6 kg)    Objective:  Physical Exam Vitals and nursing note reviewed.  Constitutional:      Appearance: Normal appearance.  HENT:     Head: Normocephalic and atraumatic.     Right Ear: Tympanic membrane, ear canal and external ear normal.     Left Ear: Tympanic membrane, ear canal and external ear normal.     Nose:     Comments:  Masked. Deffered     Mouth/Throat:     Comments: Deferred. Masked.  Eyes:     Extraocular Movements: Extraocular movements intact.     Conjunctiva/sclera: Conjunctivae normal.     Pupils: Pupils are equal, round, and reactive to light.  Cardiovascular:     Rate and Rhythm: Normal rate and regular rhythm.     Pulses: Normal pulses.     Heart sounds: Normal heart sounds.  Pulmonary:     Effort: Pulmonary effort is normal.     Breath sounds: Normal breath sounds. No wheezing or rhonchi.  Chest:  Breasts:     Tanner Score is 5.     Right: Normal. No nipple discharge.     Left: Normal. No nipple discharge.    Abdominal:     General: Abdomen is flat. Bowel sounds are normal.     Palpations: Abdomen is soft.     Comments: Hernia   Genitourinary:    Comments: Deferred. Patient sees a OBGYN  Musculoskeletal:        General: No tenderness. Normal range of motion.     Cervical back: Normal range of motion and neck supple.  Skin:    General: Skin is warm and dry.     Capillary Refill: Capillary refill takes less than 2 seconds.  Neurological:     General: No focal deficit present.     Mental Status: She is alert and oriented to person, place, and time.  Psychiatric:        Mood and Affect: Mood normal.        Behavior: Behavior normal.        Thought Content: Thought content normal.        Judgment: Judgment normal.         Assessment And Plan:     1. Establishing care with new doctor, encounter for -Discussed with the patient the patient's medical history, medications, social history, surgical history and allergies.  -Discussed with the patient the importance of screenings and immunization  2. Encounter for annual physical exam -Dicussed with the patient the importance of multivitamins, screenings and immunization  -Discussed with the patient the importance of exercise and diet.  - CBC - Hemoglobin A1c - CMP14+EGFR  3. Vitamin D deficiency -Will assess and supplement  if needed.  - VITAMIN D 25 Hydroxy (Vit-D Deficiency, Fractures)  4. Encounter for screening for lipid disorder -Educated patient about the importance of eating a well healthy diet with low fat diet, avoid fast food. Increase intake of greens, vegetables and fruits. Decrease intake of red meats.  - Lipid panel  5. Encounter for hepatitis C screening test for low risk patient - Hepatitis C antibody  6. Encounter for screening for HIV - HIV Antibody (routine testing  w rflx)  7. Overweight with body mass index (BMI) of 29 to 29.9 in adult She is encouraged to strive for BMI less than 26 to decrease cardiac risk. Advised to aim for at least 150 minutes of exercise per week.  Staying healthy and adopting a healthy lifestyle for your overall health is important. You should eat 7 or more servings of fruits and vegetables per day. You should drink plenty of water to keep yourself hydrated and your kidneys healthy. This includes about 65-80+ fluid ounces of water. Limit your intake of animal fats especially for elevated cholesterol. Avoid highly processed food and limit your salt intake if you have hypertension. Avoid foods high in saturated/Trans fats. Along with a healthy diet it is also very important to maintain time for yourself to maintain a healthy mental health with low stress levels. You should get atleast 150 min of moderate intensity exercise weekly for a healthy heart. Along with eating right and exercising, aim for at least 7-9 hours of sleep daily.  Eat more whole grains which includes barley, wheat berries, oats, brown rice and whole wheat pasta. Use healthy plant oils which include olive, soy, corn, sunflower and peanut. Limit your caffeine and sugary drinks. Limit your intake of fast foods. Limit milk and dairy products to one or two daily servings.    Follow up in 1 year or earlier if needed    Patient was given opportunity to ask questions. Patient verbalized understanding of the plan  and was able to repeat key elements of the plan. All questions were answered to their satisfaction.  Bary Castilla, NP   I, Bary Castilla, NP, have reviewed all documentation for this visit. The documentation on 07/01/20 for the exam, diagnosis, procedures, and orders are all accurate and complete.  THE PATIENT IS ENCOURAGED TO PRACTICE SOCIAL DISTANCING DUE TO THE COVID-19 PANDEMIC.

## 2020-07-02 ENCOUNTER — Other Ambulatory Visit: Payer: Self-pay | Admitting: Nurse Practitioner

## 2020-07-02 DIAGNOSIS — E559 Vitamin D deficiency, unspecified: Secondary | ICD-10-CM

## 2020-07-02 LAB — LIPID PANEL
Chol/HDL Ratio: 4 ratio (ref 0.0–4.4)
Cholesterol, Total: 205 mg/dL — ABNORMAL HIGH (ref 100–199)
HDL: 51 mg/dL (ref >39)
LDL Chol Calc (NIH): 113 mg/dL — ABNORMAL HIGH (ref 0–99)
Triglycerides: 237 mg/dL — ABNORMAL HIGH (ref 0–149)
VLDL Cholesterol Cal: 41 mg/dL — ABNORMAL HIGH (ref 5–40)

## 2020-07-02 LAB — CMP14+EGFR
ALT: 14 IU/L (ref 0–32)
AST: 19 IU/L (ref 0–40)
Albumin/Globulin Ratio: 1.4 (ref 1.2–2.2)
Albumin: 4.2 g/dL (ref 3.8–4.8)
Alkaline Phosphatase: 84 IU/L (ref 44–121)
BUN/Creatinine Ratio: 16 (ref 9–23)
BUN: 13 mg/dL (ref 6–24)
Bilirubin Total: 0.2 mg/dL (ref 0.0–1.2)
CO2: 16 mmol/L — ABNORMAL LOW (ref 20–29)
Calcium: 9.5 mg/dL (ref 8.7–10.2)
Chloride: 105 mmol/L (ref 96–106)
Creatinine, Ser: 0.83 mg/dL (ref 0.57–1.00)
Globulin, Total: 3 g/dL (ref 1.5–4.5)
Glucose: 100 mg/dL — ABNORMAL HIGH (ref 65–99)
Potassium: 4.6 mmol/L (ref 3.5–5.2)
Sodium: 139 mmol/L (ref 134–144)
Total Protein: 7.2 g/dL (ref 6.0–8.5)
eGFR: 86 mL/min/{1.73_m2} (ref >59)

## 2020-07-02 LAB — CBC
Hematocrit: 37.3 % (ref 34.0–46.6)
Hemoglobin: 12.7 g/dL (ref 11.1–15.9)
MCH: 29.4 pg (ref 26.6–33.0)
MCHC: 34 g/dL (ref 31.5–35.7)
MCV: 86 fL (ref 79–97)
Platelets: 388 10*3/uL (ref 150–450)
RBC: 4.32 x10E6/uL (ref 3.77–5.28)
RDW: 12.9 % (ref 11.7–15.4)
WBC: 9 10*3/uL (ref 3.4–10.8)

## 2020-07-02 LAB — HEPATITIS C ANTIBODY: Hep C Virus Ab: <0.1 s/co ratio (ref 0.0–0.9)

## 2020-07-02 LAB — HEMOGLOBIN A1C
Est. average glucose Bld gHb Est-mCnc: 108 mg/dL
Hgb A1c MFr Bld: 5.4 % (ref 4.8–5.6)

## 2020-07-02 LAB — VITAMIN D 25 HYDROXY (VIT D DEFICIENCY, FRACTURES): Vit D, 25-Hydroxy: 15 ng/mL — ABNORMAL LOW (ref 30.0–100.0)

## 2020-07-02 LAB — HIV ANTIBODY (ROUTINE TESTING W REFLEX): HIV Screen 4th Generation wRfx: NONREACTIVE

## 2020-07-02 MED ORDER — VITAMIN D (ERGOCALCIFEROL) 1.25 MG (50000 UNIT) PO CAPS: 50000.0000 [IU] | ORAL_CAPSULE | ORAL | 0 refills | Status: DC

## 2020-07-02 MED FILL — VIT D2 1.25 MG (50,000 UNIT: 1.25 MG | 84 days supply | Qty: 12 | Fill #0

## 2020-08-10 ENCOUNTER — Other Ambulatory Visit (HOSPITAL_COMMUNITY): Payer: Self-pay

## 2020-08-10 MED FILL — Desogest-Eth Estrad & Eth Estrad Tab 0.15-0.02/0.01 MG(21/5): ORAL | 84 days supply | Qty: 84 | Fill #0 | Status: AC

## 2020-08-20 ENCOUNTER — Telehealth: Payer: 59 | Admitting: Physician Assistant

## 2020-08-20 DIAGNOSIS — L03039 Cellulitis of unspecified toe: Secondary | ICD-10-CM | POA: Diagnosis not present

## 2020-08-20 MED ORDER — CEPHALEXIN 500 MG PO CAPS
500.0000 mg | ORAL_CAPSULE | Freq: Four times a day (QID) | ORAL | 0 refills | Status: AC
  Filled 2020-08-20: qty 20, 5d supply, fill #0

## 2020-08-20 NOTE — Progress Notes (Signed)
E Visit for Cellulitis  We are sorry that you are not feeling well. Here is how we plan to help!  Based on what you shared with me it looks like you have paronychia, or infection of nail bed and surrounding tissues. This can present as areas of skin redness, swelling, and warmth around the base of a nal, usually on the foot; it develops as a result of bacteria entering under the skin. I have prescribed:  Keflex 500mg  take one by mouth four times a day for 5 days  HOME CARE:  . Take your medications as ordered and take all of them, even if the skin irritation appears to be healing.   GET HELP RIGHT AWAY IF:  . Symptoms that don't begin to go away within 48 hours. . Severe redness persists or worsens . If the area turns color, spreads or swells. . If it blisters and opens, develops yellow-brown crust or bleeds. . You develop a fever or chills. . If the pain increases or becomes unbearable.  . Are unable to keep fluids and food down.  MAKE SURE YOU    Understand these instructions.  Will watch your condition.  Will get help right away if you are not doing well or get worse.  Thank you for choosing an e-visit. Your e-visit answers were reviewed by a board certified advanced clinical practitioner to complete your personal care plan. Depending upon the condition, your plan could have included both over the counter or prescription medications. Please review your pharmacy choice. Make sure the pharmacy is open so you can pick up prescription now. If there is a problem, you may contact your provider through [employer] and have the prescription routed to another pharmacy. Your safety is important to [place]. If you have drug allergies check your prescription carefully.  For the next 24 hours you can use MyChart to ask questions about today's visit, request a non-urgent call back, or ask for a work or school excuse. You will get an email in the next two days asking about your experience. I  hope that your e-visit has been valuable and will speed your recovery.

## 2020-08-20 NOTE — Progress Notes (Signed)
I have spent 5 minutes in review of e-visit questionnaire, review and updating patient chart, medical decision making and response to patient.   Kayon Dozier Cody Myrta Mercer, PA-C    

## 2020-08-21 ENCOUNTER — Other Ambulatory Visit (HOSPITAL_COMMUNITY): Payer: Self-pay

## 2020-08-22 ENCOUNTER — Other Ambulatory Visit (HOSPITAL_COMMUNITY): Payer: Self-pay

## 2020-09-30 ENCOUNTER — Encounter: Payer: Self-pay | Admitting: Nurse Practitioner

## 2020-09-30 ENCOUNTER — Other Ambulatory Visit: Payer: Self-pay

## 2020-09-30 ENCOUNTER — Ambulatory Visit: Payer: 59 | Admitting: Nurse Practitioner

## 2020-09-30 VITALS — BP 126/70 | Temp 97.7°F | Ht 65.0 in | Wt 176.6 lb

## 2020-09-30 DIAGNOSIS — R350 Frequency of micturition: Secondary | ICD-10-CM | POA: Diagnosis not present

## 2020-09-30 DIAGNOSIS — N3001 Acute cystitis with hematuria: Secondary | ICD-10-CM | POA: Diagnosis not present

## 2020-09-30 DIAGNOSIS — J302 Other seasonal allergic rhinitis: Secondary | ICD-10-CM

## 2020-09-30 DIAGNOSIS — R82998 Other abnormal findings in urine: Secondary | ICD-10-CM

## 2020-09-30 DIAGNOSIS — E559 Vitamin D deficiency, unspecified: Secondary | ICD-10-CM

## 2020-09-30 LAB — POCT URINALYSIS DIPSTICK
Bilirubin, UA: NEGATIVE
Glucose, UA: NEGATIVE
Ketones, UA: NEGATIVE
Nitrite, UA: NEGATIVE
Protein, UA: NEGATIVE
Spec Grav, UA: 1.01 (ref 1.010–1.025)
Urobilinogen, UA: 0.2 E.U./dL
pH, UA: 5.5 (ref 5.0–8.0)

## 2020-09-30 MED ORDER — NITROFURANTOIN MONOHYD MACRO 100 MG PO CAPS: 100.0000 mg | ORAL_CAPSULE | Freq: Two times a day (BID) | ORAL | 0 refills | Status: AC

## 2020-09-30 MED ORDER — VITAMIN D (ERGOCALCIFEROL) 1.25 MG (50000 UNIT) PO CAPS: ORAL_CAPSULE | ORAL | 0 refills | Status: DC

## 2020-09-30 NOTE — Progress Notes (Signed)
I,Tianna Badgett,acting as a Education administrator for [employer], NP.,have documented all relevant documentation on the behalf of [employer], NP,as directed by  Bary Castilla, NP while in the presence of Bary Castilla, NP.  This visit occurred during the SARS-CoV-2 public health emergency.  Safety protocols were in place, including screening questions prior to the visit, additional usage of staff PPE, and extensive cleaning of exam room while observing appropriate contact time as indicated for disinfecting solutions.  Subjective:     Patient ID: Chelsea Stewart , female    DOB: [birth year] , 50 y.o.   MRN: [id number]   Chief Complaint  Patient presents with  . Urinary Frequency    HPI  She is here for urinary symptoms. She was on the treadmill. She was having a lot of urgency and it was burning. She is drinking more water. There was some blood in her urine. More of pelvic pain. She also has allergies.     Past Medical History:  Diagnosis Date  . Allergy    seasonal  . Blood transfusion without reported diagnosis    1988  . Hyperlipidemia      Family History  Problem Relation Age of Onset  . Arthritis Mother   . Hyperlipidemia Mother   . Heart disease Mother 30       CABG--1st Dxed CAD  . Hypertension Mother   . Cancer Father   . Parkinson's disease Father   . Cancer Paternal Grandfather 83       Lung Cancer--Smoker     Current Outpatient Medications:  .  cetirizine (ZYRTEC) 10 MG tablet, Take 10 mg by mouth at bedtime., Disp: , Rfl:  .  nitrofurantoin, macrocrystal-monohydrate, (MACROBID) 100 MG capsule, Take 1 capsule (100 mg total) by mouth 2 (two) times daily for 7 days., Disp: 14 capsule, Rfl: 0 .  Cetirizine HCl (ZYRTEC ALLERGY) 10 MG CAPS, Zyrtec, Disp: , Rfl:  .  fluticasone (FLONASE) 50 MCG/ACT nasal spray, fluticasone propionate 50 mcg/actuation nasal spray,suspension, Disp: , Rfl:  .  Vitamin D, Ergocalciferol, (DRISDOL) 1.25 MG (50000 UNIT) CAPS capsule,  TAKE 1 CAPSULE BY MOUTH EVERY 7 DAYS, Disp: 12 capsule, Rfl: 0   Allergies  Allergen Reactions  . Erythromycin Nausea Only     Review of Systems  Constitutional: Negative for chills and fever.  HENT: Negative for congestion, sinus pressure, sinus pain and sneezing.   Respiratory: Negative for cough, shortness of breath and wheezing.   Cardiovascular: Negative for chest pain and palpitations.  Genitourinary: Positive for dysuria, pelvic pain and urgency. Negative for flank pain.  Allergic/Immunologic: Positive for environmental allergies.  Neurological: Negative for dizziness, weakness and headaches.     Today's Vitals   09/30/20 1507  BP: 126/70  Temp: 97.7 F (36.5 C)  Weight: 176 lb 9.6 oz (80.1 kg)  Height: 5\' 5"  (1.651 m)  PainSc: 0-No pain   Body mass index is 29.39 kg/m.   Objective:  Physical Exam Constitutional:      Appearance: Normal appearance.  HENT:     Head: Normocephalic and atraumatic.  Cardiovascular:     Rate and Rhythm: Normal rate and regular rhythm.     Pulses: Normal pulses.     Heart sounds: Normal heart sounds. No murmur heard.   Pulmonary:     Effort: Pulmonary effort is normal. No respiratory distress.     Breath sounds: Normal breath sounds.  Skin:    General: Skin is warm and dry.  Neurological:  Mental Status: She is alert.         Assessment And Plan:     1. Acute cystitis with hematuria - Culture, Urine -Will go ahead and treat her for UTI with Macrobid  -Send urine for culture  -Instructed patient of proper hygiene for wiping and also drinking a lot of water.   2. Urinary frequency - POCT Urinalysis Dipstick (81002)  3. Leukocytes in urine - Culture, Urine - nitrofurantoin, macrocrystal-monohydrate, (MACROBID) 100 MG capsule; Take 1 capsule (100 mg total) by mouth 2 (two) times daily for 7 days.  Dispense: 14 capsule; Refill: 0  4. Vitamin D deficiency -Will check and supplement if needed. Advised patient to spend  atleast 15 min. Daily in sunlight.  - Vitamin D, Ergocalciferol, (DRISDOL) 1.25 MG (50000 UNIT) CAPS capsule; TAKE 1 CAPSULE BY MOUTH EVERY 7 DAYS  Dispense: 12 capsule; Refill: 0  5. Seasonal allergic rhinitis, unspecified trigger - fluticasone (FLONASE) 50 MCG/ACT nasal spray; fluticasone propionate 50 mcg/actuation nasal spray,suspension - Cetirizine HCl (ZYRTEC ALLERGY) 10 MG CAPS; Zyrtec   The patient was encouraged to call or send a message through [place] for any questions or concerns.   Follow up: if symptoms persist or do not get better.   Side effects and appropriate use of all the medication(s) were discussed with the patient today. Patient advised to use the medication(s) as directed by their healthcare provider. The patient was encouraged to read, review, and understand all associated package inserts and contact our office with any questions or concerns. The patient accepts the risks of the treatment plan and had an opportunity to ask questions.   Patient was given opportunity to ask questions. Patient verbalized understanding of the plan and was able to repeat key elements of the plan. All questions were answered to their satisfaction.  Raman Javyn Havlin, DNP   I, Raman Marcellino Fidalgo have reviewed all documentation for this visit. The documentation on 09/30/20 for the exam, diagnosis, procedures, and orders are all accurate and complete.   IF YOU HAVE BEEN REFERRED TO A SPECIALIST, IT MAY TAKE 1-2 WEEKS TO SCHEDULE/PROCESS THE REFERRAL. IF YOU HAVE NOT HEARD FROM US/SPECIALIST IN TWO WEEKS, PLEASE GIVE [place] A CALL AT [phone].   THE PATIENT IS ENCOURAGED TO PRACTICE SOCIAL DISTANCING DUE TO THE COVID-19 PANDEMIC.

## 2020-10-05 LAB — URINE CULTURE

## 2020-10-09 DIAGNOSIS — Z20822 Contact with and (suspected) exposure to covid-19: Secondary | ICD-10-CM | POA: Diagnosis not present

## 2020-10-29 ENCOUNTER — Other Ambulatory Visit (HOSPITAL_COMMUNITY): Payer: Self-pay

## 2020-10-29 MED ORDER — DESOGESTREL-ETHINYL ESTRADIOL 0.15-0.02/0.01 MG (21/5) PO TABS
1.0000 | ORAL_TABLET | Freq: Every day | ORAL | 0 refills | Status: DC
  Filled 2020-10-29: qty 84, 84d supply, fill #0

## 2020-11-24 ENCOUNTER — Other Ambulatory Visit (HOSPITAL_COMMUNITY): Payer: Self-pay

## 2020-11-24 DIAGNOSIS — Z124 Encounter for screening for malignant neoplasm of cervix: Secondary | ICD-10-CM | POA: Diagnosis not present

## 2020-11-24 DIAGNOSIS — Z01419 Encounter for gynecological examination (general) (routine) without abnormal findings: Secondary | ICD-10-CM | POA: Diagnosis not present

## 2020-11-24 MED ORDER — DESOGESTREL-ETHINYL ESTRADIOL 0.15-0.02/0.01 MG (21/5) PO TABS
1.0000 | ORAL_TABLET | Freq: Every day | ORAL | 4 refills | Status: DC
  Filled 2020-11-24 – 2021-01-28 (×2): qty 84, 84d supply, fill #0
  Filled 2021-04-27: qty 84, 84d supply, fill #1
  Filled 2021-07-23: qty 84, 84d supply, fill #2
  Filled 2021-10-11: qty 84, 84d supply, fill #3

## 2020-12-07 ENCOUNTER — Encounter: Payer: Self-pay | Admitting: Nurse Practitioner

## 2020-12-11 ENCOUNTER — Encounter: Payer: Self-pay | Admitting: Internal Medicine

## 2020-12-14 ENCOUNTER — Ambulatory Visit (AMBULATORY_SURGERY_CENTER): Payer: 59

## 2020-12-14 VITALS — Ht 65.0 in | Wt 175.0 lb

## 2020-12-14 DIAGNOSIS — Z1211 Encounter for screening for malignant neoplasm of colon: Secondary | ICD-10-CM

## 2020-12-14 NOTE — Progress Notes (Signed)
No egg or soy allergy known to patient  No issues with past sedation with any surgeries or procedures Patient denies ever being told they had issues or difficulty with intubation  No FH of Malignant Hyperthermia No diet pills per patient No home 02 use per patient  No blood thinners per patient  Pt denies issues with constipation  No A fib or A flutter  EMMI video to pt or via [place] 19 guidelines implemented in [place] today with Pt and RN   Pt is fully vaccinated  for Covid    Due to the COVID-19 pandemic we are asking patients to follow certain guidelines.  Pt aware of COVID protocols and [facility] guidelines   Pt request miralax prep

## 2020-12-28 ENCOUNTER — Encounter: Payer: Self-pay | Admitting: Internal Medicine

## 2021-01-01 ENCOUNTER — Ambulatory Visit (AMBULATORY_SURGERY_CENTER): Payer: 59 | Admitting: Internal Medicine

## 2021-01-01 ENCOUNTER — Encounter: Payer: Self-pay | Admitting: Internal Medicine

## 2021-01-01 ENCOUNTER — Other Ambulatory Visit: Payer: Self-pay

## 2021-01-01 VITALS — BP 135/88 | HR 77 | Temp 96.2°F | Resp 19 | Ht 65.0 in | Wt 175.0 lb

## 2021-01-01 DIAGNOSIS — Z1211 Encounter for screening for malignant neoplasm of colon: Secondary | ICD-10-CM | POA: Diagnosis not present

## 2021-01-01 HISTORY — PX: COLONOSCOPY: SHX174

## 2021-01-01 MED ORDER — SODIUM CHLORIDE 0.9 % IV SOLN: 500.0000 mL | Freq: Once | INTRAVENOUS | Status: DC
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
## 2021-01-01 NOTE — Progress Notes (Signed)
Pt's states no medical or surgical changes since previsit or office visit.  ° °Vitals CW °

## 2021-01-01 NOTE — Patient Instructions (Addendum)
Your hemorrhoids were somewhat swollen which often occurs due to the prep-related diarrhea.  Otherwise all ok - no polyps or cancer seen.  Next routine colonoscopy or other screening test in 10 years - 2032.  I appreciate the opportunity to care for you. Gatha Mayer, MD, [facility] Resume previous diet Continue current medications Repeat colonoscopy in 10 years !!!!!  YOU HAD AN ENDOSCOPIC PROCEDURE TODAY AT [facility]:   Refer to the procedure report that was given to you for any specific questions about what was found during the examination.  If the procedure report does not answer your questions, please call your gastroenterologist to clarify.  If you requested that your care partner not be given the details of your procedure findings, then the procedure report has been included in a sealed envelope for you to review at your convenience later.  YOU SHOULD EXPECT: Some feelings of bloating in the abdomen. Passage of more gas than usual.  Walking can help get rid of the air that was put into your GI tract during the procedure and reduce the bloating. If you had a lower endoscopy (such as a colonoscopy or flexible sigmoidoscopy) you may notice spotting of blood in your stool or on the toilet paper. If you underwent a bowel prep for your procedure, you may not have a normal bowel movement for a few days.  Please Note:  You might notice some irritation and congestion in your nose or some drainage.  This is from the oxygen used during your procedure.  There is no need for concern and it should clear up in a day or so.  SYMPTOMS TO REPORT IMMEDIATELY:  Following lower endoscopy (colonoscopy or flexible sigmoidoscopy):  Excessive amounts of blood in the stool  Significant tenderness or worsening of abdominal pains  Swelling of the abdomen that is new, acute  Fever of 100F or higher  For urgent or emergent issues, a gastroenterologist can be reached at any hour by calling [phone]  [phone]. Do not use MyChart messaging for urgent concerns.   DIET:  We do recommend a small meal at first, but then you may proceed to your regular diet.  Drink plenty of fluids but you should avoid alcoholic beverages for 24 hours.  ACTIVITY:  You should plan to take it easy for the rest of today and you should NOT DRIVE or use heavy machinery until tomorrow (because of the sedation medicines used during the test).    FOLLOW UP: Our staff will call the number listed on your records 48-72 hours following your procedure to check on you and address any questions or concerns that you may have regarding the information given to you following your procedure. If we do not reach you, we will leave a message.  We will attempt to reach you two times.  During this call, we will ask if you have developed any symptoms of COVID 19. If you develop any symptoms (ie: fever, flu-like symptoms, shortness of breath, cough etc.) before then, please call [phone].  If you test positive for Covid 19 in the 2 weeks post procedure, please call and report this information to [place].    If any biopsies were taken you will be contacted by phone or by letter within the next 1-3 weeks.  Please call us at [phone] if you have not heard about the biopsies in 3 weeks.   SIGNATURES/CONFIDENTIALITY: You and/or your care partner have signed paperwork which will be entered into your electronic  medical record.  These signatures attest to the fact that that the information above on your After Visit Summary has been reviewed and is understood.  Full responsibility of the confidentiality of this discharge information lies with you and/or your care-partner.  

## 2021-01-01 NOTE — Progress Notes (Signed)
Report given to PACU, vss 

## 2021-01-01 NOTE — Op Note (Signed)
[facility] Patient Name: Chelsea Stewart Procedure Date: 01/01/2021 7:25 AM MRN: [id number] Endoscopist: Gatha Mayer , MD Age: 50 Referring MD:  Date of Birth: 1971/03/18 Gender: Female Account #: [id number] Procedure:                Colonoscopy Indications:              Screening for colorectal malignant neoplasm, This                            is the patient's first colonoscopy Medicines:                Monitored Anesthesia Care Procedure:                Pre-Anesthesia Assessment:                           - Prior to the procedure, a History and Physical                            was performed, and patient medications and                            allergies were reviewed. The patient's tolerance of                            previous anesthesia was also reviewed. The risks                            and benefits of the procedure and the sedation                            options and risks were discussed with the patient.                            All questions were answered, and informed consent                            was obtained. Prior Anticoagulants: The patient has                            taken no previous anticoagulant or antiplatelet                            agents. ASA Grade Assessment: II - A patient with                            mild systemic disease. After reviewing the risks                            and benefits, the patient was deemed in                            satisfactory condition to undergo the procedure.  After obtaining informed consent, the colonoscope                            was passed under direct vision. Throughout the                            procedure, the patient's blood pressure, pulse, and                            oxygen saturations were monitored continuously. The                            Olympus [id number]-H190DL [id number]) Colonoscope was                            introduced through the anus and  advanced to the the                            cecum, identified by appendiceal orifice and                            ileocecal valve. The colonoscopy was performed                            without difficulty. The patient tolerated the                            procedure well. The quality of the bowel                            preparation was good. The ileocecal valve,                            appendiceal orifice, and rectum were photographed.                            The bowel preparation used was Miralax via split                            dose instruction. Scope In: 8:07:16 AM Scope Out: 8:20:42 AM Scope Withdrawal Time: 0 hours 8 minutes 59 seconds  Total Procedure Duration: 0 hours 13 minutes 26 seconds  Findings:                 The perianal and digital rectal examinations were                            normal.                           External hemorrhoids were found.                           The exam was otherwise without abnormality on  direct and retroflexion views. Complications:            No immediate complications. Estimated Blood Loss:     Estimated blood loss: none. Impression:               - External hemorrhoids.                           - The examination was otherwise normal on direct                            and retroflexion views.                           - No specimens collected. Recommendation:           - Patient has a contact number available for                            emergencies. The signs and symptoms of potential                            delayed complications were discussed with the                            patient. Return to normal activities tomorrow.                            Written discharge instructions were provided to the                            patient.                           - Resume previous diet.                           - Continue present medications.                           - Repeat  colonoscopy in 10 years for screening                            purposes. Gatha Mayer, MD 01/01/2021 8:27:31 AM This report has been signed electronically.

## 2021-01-05 ENCOUNTER — Telehealth: Payer: Self-pay

## 2021-01-05 NOTE — Telephone Encounter (Signed)
  Follow up Call-  Call back number 01/01/2021  Post procedure Call Back phone  # [phone]  Permission to leave phone message Yes  Some recent data might be hidden     Patient questions:  Do you have a fever, pain , or abdominal swelling? No. Pain Score  0 *  Have you tolerated food without any problems? Yes.    Have you been able to return to your normal activities? Yes.    Do you have any questions about your discharge instructions: Diet   No. Medications  No. Follow up visit  No.  Do you have questions or concerns about your Care? No.  Actions: * If pain score is 4 or above: No action needed, pain <4.

## 2021-01-13 DIAGNOSIS — Z1231 Encounter for screening mammogram for malignant neoplasm of breast: Secondary | ICD-10-CM | POA: Diagnosis not present

## 2021-01-28 ENCOUNTER — Other Ambulatory Visit (HOSPITAL_COMMUNITY): Payer: Self-pay

## 2021-02-13 ENCOUNTER — Telehealth: Payer: 59 | Admitting: Nurse Practitioner

## 2021-02-13 DIAGNOSIS — J329 Chronic sinusitis, unspecified: Secondary | ICD-10-CM | POA: Diagnosis not present

## 2021-02-13 DIAGNOSIS — B9689 Other specified bacterial agents as the cause of diseases classified elsewhere: Secondary | ICD-10-CM

## 2021-02-13 MED ORDER — AMOXICILLIN-POT CLAVULANATE 875-125 MG PO TABS
1.0000 | ORAL_TABLET | Freq: Two times a day (BID) | ORAL | 0 refills | Status: DC
Start: 2021-02-13 — End: 2021-03-03

## 2021-02-13 NOTE — Progress Notes (Signed)

## 2021-03-03 ENCOUNTER — Ambulatory Visit: Payer: 59 | Admitting: Nurse Practitioner

## 2021-03-03 ENCOUNTER — Encounter: Payer: Self-pay | Admitting: Nurse Practitioner

## 2021-03-03 ENCOUNTER — Other Ambulatory Visit: Payer: Self-pay

## 2021-03-03 VITALS — BP 116/88 | Temp 98.4°F | Ht 65.0 in | Wt 182.4 lb

## 2021-03-03 DIAGNOSIS — R0989 Other specified symptoms and signs involving the circulatory and respiratory systems: Secondary | ICD-10-CM

## 2021-03-03 DIAGNOSIS — J0191 Acute recurrent sinusitis, unspecified: Secondary | ICD-10-CM

## 2021-03-03 DIAGNOSIS — R0981 Nasal congestion: Secondary | ICD-10-CM | POA: Diagnosis not present

## 2021-03-03 LAB — POC COVID19 BINAXNOW: SARS Coronavirus 2 Ag: NEGATIVE

## 2021-03-03 MED ORDER — AZITHROMYCIN 250 MG PO TABS: ORAL_TABLET | ORAL | 0 refills | Status: AC

## 2021-03-03 NOTE — Patient Instructions (Signed)
Postnasal Drip ?Postnasal drip is the feeling of mucus going down the back of your throat. Mucus is a slimy substance that moistens and cleans your nose and throat, as well as the air pockets in face bones near your forehead and cheeks (sinuses). Small amounts of mucus pass from your nose and sinuses down the back of your throat all the time. This is normal. When you produce too much mucus or the mucus gets too thick, you can feel it. ?Some common causes of postnasal drip include: ?Having more mucus because of: ?A cold or the flu. ?Allergies. ?Cold air. ?Certain medicines. ?Having more mucus that is thicker because of: ?A sinus or nasal infection. ?Dry air. ?A food allergy. ?Follow these instructions at home: ?Relieving discomfort ? ?Gargle with a salt-water mixture 3-4 times a day or as needed. To make a salt-water mixture, completely dissolve ?-1 tsp of salt in 1 cup of warm water. ?If the air in your home is dry, use a humidifier to add moisture to the air. ?Use a saline spray or container (neti pot) to flush out the nose (nasal irrigation). These methods can help clear away mucus and keep the nasal passages moist. ?General instructions ?Take over-the-counter and prescription medicines only as told by your health care provider. ?Follow instructions from your health care provider about eating or drinking restrictions. You may need to avoid caffeine. ?Avoid things that you know you are allergic to (allergens), like dust, mold, pollen, pets, or certain foods. ?Drink enough fluid to keep your urine pale yellow. ?Keep all follow-up visits as told by your health care provider. This is important. ?Contact a health care provider if: ?You have a fever. ?You have a sore throat. ?You have difficulty swallowing. ?You have headache. ?You have sinus pain. ?You have a cough that does not go away. ?The mucus from your nose becomes thick and is green or yellow in color. ?You have cold or flu symptoms that last more than 10  days. ?Summary ?Postnasal drip is the feeling of mucus going down the back of your throat. ?If your health care provider approves, use nasal irrigation or a nasal spray 2?4 times a day. ?Avoid things that you know you are allergic to (allergens), like dust, mold, pollen, pets, or certain foods. ?This information is not intended to replace advice given to you by your health care provider. Make sure you discuss any questions you have with your health care provider. ?Document Revised: 01/21/2020 Document Reviewed: 01/21/2020 ?Elsevier Patient Education ? 2022 [employer] ? ?

## 2021-03-03 NOTE — Progress Notes (Signed)
I,Victoria T Hamilton,acting as a Education administrator for [employer], NP.,have documented all relevant documentation on the behalf of [employer], NP,as directed by  Bary Castilla, NP while in the presence of Bary Castilla, NP.  This visit occurred during the SARS-CoV-2 public health emergency.  Safety protocols were in place, including screening questions prior to the visit, additional usage of staff PPE, and extensive cleaning of exam room while observing appropriate contact time as indicated for disinfecting solutions.  Subjective:     Patient ID: Chelsea Stewart , female    DOB: Aug 16, 1970 , 50 y.o.   MRN: [id number]   Chief Complaint  Patient presents with   Sinus Problem     HPI  Pt presents for sinus issue along with runny nose. She did a covid test this weekend at home test came back negative, nasal drip is mild only on the left side. She has been using flonase everyday & ibprofeun over the counter. She does want to know if vitamin D should still be taken. No fever, no sore throat. Just a lot congestion and drainage.   Sinus Problem Associated symptoms include congestion, headaches and sinus pressure. Pertinent negatives include no chills, coughing, shortness of breath or sore throat.    Past Medical History:  Diagnosis Date   Allergy    seasonal   Anxiety    Blood transfusion without reported diagnosis    1988   Hyperlipidemia      Family History  Problem Relation Age of Onset   Arthritis Mother    Hyperlipidemia Mother    Heart disease Mother 27       CABG--1st Dxed CAD   Hypertension Mother    Cancer Father    Parkinson's disease Father    Cancer Paternal Grandfather 72       Lung Cancer--Smoker   Colon cancer Neg Hx    Colon polyps Neg Hx    Esophageal cancer Neg Hx    Stomach cancer Neg Hx    Rectal cancer Neg Hx      Current Outpatient Medications:    azithromycin (ZITHROMAX) 250 MG tablet, Take 2 tablets (500 mg) on  Day 1,  followed by 1 tablet  (250 mg) once daily on Days 2 through 5., Disp: 6 each, Rfl: 0   cetirizine (ZYRTEC) 10 MG tablet, Take 10 mg by mouth at bedtime., Disp: , Rfl:    desogestrel-ethinyl estradiol (PIMTREA) 0.15-0.02/0.01 MG (21/5) tablet, TAKE 1 TABLET BY MOUTH ONCE DAILY, Disp: 84 tablet, Rfl: 4   fluticasone (FLONASE) 50 MCG/ACT nasal spray, daily as needed., Disp: , Rfl:    ibuprofen (ADVIL) 400 MG tablet, Take 400 mg by mouth every 6 (six) hours as needed., Disp: , Rfl:    Vitamin D, Ergocalciferol, (DRISDOL) 1.25 MG (50000 UNIT) CAPS capsule, TAKE 1 CAPSULE BY MOUTH EVERY 7 DAYS, Disp: 12 capsule, Rfl: 0   amoxicillin-clavulanate (AUGMENTIN) 875-125 MG tablet, Take 1 tablet by mouth 2 (two) times daily., Disp: 14 tablet, Rfl: 0   No Active Allergies    Review of Systems  Constitutional:  Positive for fatigue. Negative for chills and fever.  HENT:  Positive for congestion, postnasal drip, rhinorrhea and sinus pressure. Negative for sore throat.   Respiratory: Negative.  Negative for cough, shortness of breath and wheezing.   Cardiovascular: Negative.  Negative for chest pain and palpitations.  Endocrine: Negative for polydipsia, polyphagia and polyuria.  Neurological:  Positive for headaches. Negative for weakness.  Psychiatric/Behavioral: Negative.  Today's Vitals   03/03/21 1038  BP: 116/88  Temp: 98.4 F (36.9 C)  SpO2: 99%  Weight: 182 lb 6.4 oz (82.7 kg)  Height: 5\' 5"  (1.651 m)   Body mass index is 30.35 kg/m.   Objective:  Physical Exam Constitutional:      Appearance: Normal appearance.  HENT:     Head: Normocephalic and atraumatic.     Right Ear: Tympanic membrane, ear canal and external ear normal. There is no impacted cerumen.     Left Ear: Tympanic membrane, ear canal and external ear normal. There is no impacted cerumen.     Nose: Congestion and rhinorrhea present.  Cardiovascular:     Rate and Rhythm: Normal rate and regular rhythm.     Pulses: Normal pulses.     Heart  sounds: Normal heart sounds. No murmur heard. Pulmonary:     Effort: Pulmonary effort is normal. No respiratory distress.     Breath sounds: Normal breath sounds. No wheezing.  Skin:    General: Skin is warm and dry.     Capillary Refill: Capillary refill takes less than 2 seconds.  Neurological:     Mental Status: She is alert and oriented to person, place, and time.        Assessment And Plan:     1. Sinus complaint - Novel Coronavirus, NAA ([facility]) - POC COVID-19  2. Acute recurrent sinusitis, unspecified location - azithromycin (ZITHROMAX) 250 MG tablet; Take 2 tablets (500 mg) on  Day 1,  followed by 1 tablet (250 mg) once daily on Days 2 through 5.  Dispense: 6 each; Refill: 0 - POC COVID-19  3. Sinus congestion - POC COVID-19    The patient was encouraged to call or send a message through [place] for any questions or concerns.   Follow up: if symptoms persist or do not get better.   Side effects and appropriate use of all the medication(s) were discussed with the patient today. Patient advised to use the medication(s) as directed by their healthcare provider. The patient was encouraged to read, review, and understand all associated package inserts and contact our office with any questions or concerns. The patient accepts the risks of the treatment plan and had an opportunity to ask questions.   Advised patient to take Vitamin C, D, Zinc.  Keep yourself hydrated with a lot of water and rest. Take Delsym for cough and Mucinex as need. Take Tylenol or pain reliever every 4-6 hours as needed for pain/fever/body ache. If you have elevated blood pressure, you can take OTC Corcidin. You can also take OTC oscillococcinum to help with your symptoms.  Educated patient if symptoms get worse or if she experiences any SOB, chest pain or pain in her legs to seek immediate emergency care. Continue to monitor your oxygen levels. Call us if you have any questions.  Patient was given  opportunity to ask questions. Patient verbalized understanding of the plan and was able to repeat key elements of the plan. All questions were answered to their satisfaction.  Raman Raini Tiley, DNP   I, Raman Yusef Lamp have reviewed all documentation for this visit. The documentation on 03/03/21 for the exam, diagnosis, procedures, and orders are all accurate and complete.                IF YOU HAVE BEEN REFERRED TO A SPECIALIST, IT MAY TAKE 1-2 WEEKS TO SCHEDULE/PROCESS THE REFERRAL. IF YOU HAVE NOT HEARD FROM US/SPECIALIST IN TWO WEEKS, PLEASE GIVE [place] A  CALL AT [phone].   THE PATIENT IS ENCOURAGED TO PRACTICE SOCIAL DISTANCING DUE TO THE COVID-19 PANDEMIC.

## 2021-03-04 LAB — NOVEL CORONAVIRUS, NAA: SARS-CoV-2, NAA: NOT DETECTED

## 2021-03-04 LAB — SARS-COV-2, NAA 2 DAY TAT

## 2021-03-12 DIAGNOSIS — J3489 Other specified disorders of nose and nasal sinuses: Secondary | ICD-10-CM | POA: Diagnosis not present

## 2021-03-12 DIAGNOSIS — J324 Chronic pansinusitis: Secondary | ICD-10-CM | POA: Diagnosis not present

## 2021-03-12 DIAGNOSIS — J329 Chronic sinusitis, unspecified: Secondary | ICD-10-CM | POA: Diagnosis not present

## 2021-03-12 DIAGNOSIS — J342 Deviated nasal septum: Secondary | ICD-10-CM | POA: Diagnosis not present

## 2021-04-09 DIAGNOSIS — J329 Chronic sinusitis, unspecified: Secondary | ICD-10-CM | POA: Diagnosis not present

## 2021-04-09 DIAGNOSIS — J324 Chronic pansinusitis: Secondary | ICD-10-CM | POA: Diagnosis not present

## 2021-04-09 DIAGNOSIS — J342 Deviated nasal septum: Secondary | ICD-10-CM | POA: Diagnosis not present

## 2021-04-27 ENCOUNTER — Other Ambulatory Visit (HOSPITAL_COMMUNITY): Payer: Self-pay

## 2021-04-28 ENCOUNTER — Other Ambulatory Visit (HOSPITAL_COMMUNITY): Payer: Self-pay

## 2021-04-30 ENCOUNTER — Other Ambulatory Visit (HOSPITAL_COMMUNITY): Payer: Self-pay

## 2021-05-03 ENCOUNTER — Other Ambulatory Visit (HOSPITAL_COMMUNITY): Payer: Self-pay

## 2021-05-03 DIAGNOSIS — J343 Hypertrophy of nasal turbinates: Secondary | ICD-10-CM | POA: Diagnosis not present

## 2021-05-03 DIAGNOSIS — J342 Deviated nasal septum: Secondary | ICD-10-CM | POA: Diagnosis not present

## 2021-05-03 DIAGNOSIS — J324 Chronic pansinusitis: Secondary | ICD-10-CM | POA: Diagnosis not present

## 2021-05-03 MED ORDER — FLUTICASONE PROPIONATE 50 MCG/ACT NA SUSP
NASAL | 11 refills | Status: DC
  Filled 2021-05-03: qty 16, 30d supply, fill #0
  Filled 2021-06-24: qty 16, 30d supply, fill #1
  Filled 2021-07-23: qty 16, 30d supply, fill #2
  Filled 2021-10-11: qty 16, 30d supply, fill #3
  Filled 2021-12-08: qty 16, 30d supply, fill #4
  Filled 2022-03-13: qty 16, 30d supply, fill #5

## 2021-05-03 MED ORDER — LEVOFLOXACIN 750 MG PO TABS
ORAL_TABLET | ORAL | 0 refills | Status: DC
  Filled 2021-05-03: qty 10, 10d supply, fill #0

## 2021-05-04 ENCOUNTER — Other Ambulatory Visit (HOSPITAL_COMMUNITY): Payer: Self-pay

## 2021-05-28 DIAGNOSIS — J343 Hypertrophy of nasal turbinates: Secondary | ICD-10-CM | POA: Diagnosis not present

## 2021-05-28 DIAGNOSIS — J342 Deviated nasal septum: Secondary | ICD-10-CM | POA: Diagnosis not present

## 2021-05-28 DIAGNOSIS — J324 Chronic pansinusitis: Secondary | ICD-10-CM | POA: Diagnosis not present

## 2021-06-24 ENCOUNTER — Other Ambulatory Visit (HOSPITAL_COMMUNITY): Payer: Self-pay

## 2021-07-07 ENCOUNTER — Other Ambulatory Visit: Payer: Self-pay

## 2021-07-07 ENCOUNTER — Other Ambulatory Visit (HOSPITAL_COMMUNITY): Payer: Self-pay

## 2021-07-07 ENCOUNTER — Encounter: Payer: Self-pay | Admitting: Nurse Practitioner

## 2021-07-07 ENCOUNTER — Ambulatory Visit (INDEPENDENT_AMBULATORY_CARE_PROVIDER_SITE_OTHER): Payer: 59 | Admitting: Nurse Practitioner

## 2021-07-07 VITALS — BP 122/88 | HR 79 | Temp 97.7°F | Ht 65.0 in | Wt 185.0 lb

## 2021-07-07 DIAGNOSIS — Z0000 Encounter for general adult medical examination without abnormal findings: Secondary | ICD-10-CM | POA: Diagnosis not present

## 2021-07-07 DIAGNOSIS — Z6830 Body mass index (BMI) 30.0-30.9, adult: Secondary | ICD-10-CM

## 2021-07-07 DIAGNOSIS — E559 Vitamin D deficiency, unspecified: Secondary | ICD-10-CM | POA: Diagnosis not present

## 2021-07-07 DIAGNOSIS — E6609 Other obesity due to excess calories: Secondary | ICD-10-CM

## 2021-07-07 MED ORDER — VITAMIN D (ERGOCALCIFEROL) 1.25 MG (50000 UNIT) PO CAPS
ORAL_CAPSULE | ORAL | 1 refills | Status: DC
  Filled 2021-07-07: qty 12, 84d supply, fill #0
  Filled 2021-12-08: qty 12, 84d supply, fill #1

## 2021-07-07 MED ORDER — VITAMIN D (ERGOCALCIFEROL) 1.25 MG (50000 UNIT) PO CAPS
ORAL_CAPSULE | ORAL | 0 refills | Status: DC
  Filled 2021-07-07: qty 12, fill #0

## 2021-07-07 NOTE — Progress Notes (Signed)
I,Victoria T Hamilton,acting as a Neurosurgeon for Arnette Felts, FNP.,have documented all relevant documentation on the behalf of Arnette Felts, FNP,as directed by  Arnette Felts, FNP while in the presence of Arnette Felts, FNP.   This visit occurred during the SARS-CoV-2 public health emergency.  Safety protocols were in place, including screening questions prior to the visit, additional usage of staff PPE, and extensive cleaning of exam room while observing appropriate contact time as indicated for disinfecting solutions.  Subjective:     Patient ID: Chelsea Stewart , female    DOB: [birth year] , 51 y.o.   MRN: [id number]   Chief Complaint  Patient presents with   Annual Exam    HPI  Pt presents for HM. She is followed by Dr. Patricia Pesa, plan to keep on OCP until age 51. She has no specific questions or concerns at the moment. She has seen ENT for sinus issues and they are recommending surgery - left side of sinuses are blocked and has a deviated septum on the left.     Past Medical History:  Diagnosis Date   Allergy    seasonal   Anxiety    Blood transfusion without reported diagnosis    1988   Hyperlipidemia      Family History  Problem Relation Age of Onset   Arthritis Mother    Hyperlipidemia Mother    Heart disease Mother 13       CABG--1st Dxed CAD   Hypertension Mother    Cancer Father    Parkinson's disease Father    Cancer Paternal Grandfather 72       Lung Cancer--Smoker   Colon cancer Neg Hx    Colon polyps Neg Hx    Esophageal cancer Neg Hx    Stomach cancer Neg Hx    Rectal cancer Neg Hx      Current Outpatient Medications:    cetirizine (ZYRTEC) 10 MG tablet, Take 10 mg by mouth at bedtime., Disp: , Rfl:    desogestrel-ethinyl estradiol (PIMTREA) 0.15-0.02/0.01 MG (21/5) tablet, TAKE 1 TABLET BY MOUTH ONCE DAILY, Disp: 84 tablet, Rfl: 4   fluticasone (FLONASE) 50 MCG/ACT nasal spray, daily as needed., Disp: , Rfl:    fluticasone (FLONASE) 50 MCG/ACT nasal spray,  Place  2 sprays in each nostril daily., Disp: 16 g, Rfl: 11   ibuprofen (ADVIL) 400 MG tablet, Take 400 mg by mouth every 6 (six) hours as needed., Disp: , Rfl:    Vitamin D, Ergocalciferol, (DRISDOL) 1.25 MG (50000 UNIT) CAPS capsule, TAKE 1 CAPSULE BY MOUTH EVERY 7 DAYS, Disp: 12 capsule, Rfl: 1   No Active Allergies    The patient states she uses OCP (estrogen/progesterone) for birth control.  No LMP recorded (lmp unknown).. Negative for Dysmenorrhea and Negative for Menorrhagia. Negative for: breast discharge, breast lump(s), breast pain and breast self exam. Associated symptoms include abnormal vaginal bleeding. Pertinent negatives include abnormal bleeding (hematology), anxiety, decreased libido, depression, difficulty falling sleep, dyspareunia, history of infertility, nocturia, sexual dysfunction, sleep disturbances, urinary incontinence, urinary urgency, vaginal discharge and vaginal itching. Diet regular.  The patient states her exercise level is daily - 30 minutes on treadmill and walks her dog regularly.   The patient's tobacco use is:  Social History   Tobacco Use  Smoking Status Never   Passive exposure: Never  Smokeless Tobacco Never  . She has been exposed to passive smoke. The patient's alcohol use is:  Social History   Substance and Sexual Activity  Alcohol  Use Yes   Alcohol/week: 1.0 standard drink   Types: 1 Cans of beer per week   Additional information: Last pap 12/07/2020, next one scheduled for 12/07/2021 history of ASCUS so has them done yearly  Review of Systems  Constitutional: Negative.   HENT: Negative.    Eyes: Negative.   Respiratory: Negative.    Cardiovascular: Negative.   Gastrointestinal: Negative.   Endocrine: Negative.   Genitourinary: Negative.   Musculoskeletal: Negative.   Skin: Negative.   Allergic/Immunologic: Negative.   Neurological: Negative.   Hematological: Negative.   Psychiatric/Behavioral: Negative.      Today's Vitals    07/07/21 0836  BP: 122/88  Pulse: 79  Temp: 97.7 F (36.5 C)  Weight: 185 lb (83.9 kg)  Height: 5\' 5"  (1.651 m)   Body mass index is 30.79 kg/m.  Wt Readings from Last 3 Encounters:  07/07/21 185 lb (83.9 kg)  03/03/21 182 lb 6.4 oz (82.7 kg)  01/01/21 175 lb (79.4 kg)    Objective:  Physical Exam Constitutional:      General: She is not in acute distress.    Appearance: Normal appearance. She is well-developed. She is obese.  HENT:     Head: Normocephalic and atraumatic.     Right Ear: Hearing, tympanic membrane, ear canal and external ear normal. There is no impacted cerumen.     Left Ear: Hearing, tympanic membrane, ear canal and external ear normal. There is no impacted cerumen.     Nose:     Comments: Deferred - masked    Mouth/Throat:     Comments: Deferred - masked Eyes:     General: Lids are normal.     Extraocular Movements: Extraocular movements intact.     Conjunctiva/sclera: Conjunctivae normal.     Pupils: Pupils are equal, round, and reactive to light.     Funduscopic exam:    Right eye: No papilledema.        Left eye: No papilledema.  Neck:     Thyroid: No thyroid mass.     Vascular: No carotid bruit.  Cardiovascular:     Rate and Rhythm: Normal rate and regular rhythm.     Pulses: Normal pulses.     Heart sounds: Normal heart sounds. No murmur heard. Pulmonary:     Effort: Pulmonary effort is normal. No respiratory distress.     Breath sounds: Normal breath sounds. No wheezing.  Chest:     Chest wall: No mass.  Breasts:    Tanner Score is 5.     Right: Normal. No mass or tenderness.     Left: Normal. No mass or tenderness.  Abdominal:     General: Abdomen is flat. Bowel sounds are normal. There is no distension.     Palpations: Abdomen is soft.     Tenderness: There is no abdominal tenderness.  Genitourinary:    Comments: Deferred - has GYN Musculoskeletal:        General: No swelling or tenderness. Normal range of motion.     Cervical  back: Full passive range of motion without pain, normal range of motion and neck supple.     Right lower leg: No edema.     Left lower leg: No edema.  Lymphadenopathy:     Upper Body:     Right upper body: No supraclavicular, axillary or pectoral adenopathy.     Left upper body: No supraclavicular, axillary or pectoral adenopathy.  Skin:    General: Skin is warm and dry.  Capillary Refill: Capillary refill takes less than 2 seconds.  Neurological:     General: No focal deficit present.     Mental Status: She is alert and oriented to person, place, and time.     Cranial Nerves: No cranial nerve deficit.     Sensory: No sensory deficit.     Motor: No weakness.  Psychiatric:        Mood and Affect: Mood normal.        Behavior: Behavior normal.        Thought Content: Thought content normal.        Judgment: Judgment normal.        Assessment And Plan:     1. Encounter for health maintenance examination in adult Behavior modifications discussed and diet history reviewed.   Pt will continue to exercise regularly and modify diet with low GI, plant based foods and decrease intake of processed foods.  Recommend intake of daily multivitamin, Vitamin D, and calcium.  Recommend mammogram (will get records from GYN) and colonoscopy (up to date) for preventive screenings, as well as recommend immunizations that include influenza, TDAP, and Shingles (declines at this time) - POCT Urinalysis Dipstick (81002) - Microalbumin / creatinine urine ratio - CMP14+EGFR - CBC - Hemoglobin A1c - Lipid panel  2. Class 1 obesity due to excess calories with body mass index (BMI) of 30.0 to 30.9 in adult, unspecified whether serious comorbidity present Chronic Discussed healthy diet and regular exercise options  Continue to exercise at least 150 minutes per week with 2 days of strength training  3. Vitamin D deficiency Will check vitamin D level and supplement as needed.    Also encouraged to  spend 15 minutes in the sun daily.  - VITAMIN D 25 Hydroxy (Vit-D Deficiency, Fractures) - Vitamin D, Ergocalciferol, (DRISDOL) 1.25 MG (50000 UNIT) CAPS capsule; TAKE 1 CAPSULE BY MOUTH EVERY 7 DAYS  Dispense: 12 capsule; Refill: 1     Patient was given opportunity to ask questions. Patient verbalized understanding of the plan and was able to repeat key elements of the plan. All questions were answered to their satisfaction.   Arnette Felts, FNP   I, Arnette Felts, FNP, have reviewed all documentation for this visit. The documentation on 07/07/21 for the exam, diagnosis, procedures, and orders are all accurate and complete.   THE PATIENT IS ENCOURAGED TO PRACTICE SOCIAL DISTANCING DUE TO THE COVID-19 PANDEMIC.

## 2021-07-07 NOTE — Patient Instructions (Signed)

## 2021-07-08 LAB — CBC
Hematocrit: 38.1 % (ref 34.0–46.6)
Hemoglobin: 13.1 g/dL (ref 11.1–15.9)
MCH: 30.4 pg (ref 26.6–33.0)
MCHC: 34.4 g/dL (ref 31.5–35.7)
MCV: 88 fL (ref 79–97)
Platelets: 357 10*3/uL (ref 150–450)
RBC: 4.31 x10E6/uL (ref 3.77–5.28)
RDW: 12.5 % (ref 11.7–15.4)
WBC: 7.6 10*3/uL (ref 3.4–10.8)

## 2021-07-08 LAB — LIPID PANEL
Chol/HDL Ratio: 3.8 ratio (ref 0.0–4.4)
Cholesterol, Total: 226 mg/dL — ABNORMAL HIGH (ref 100–199)
HDL: 59 mg/dL (ref >39)
LDL Chol Calc (NIH): 133 mg/dL — ABNORMAL HIGH (ref 0–99)
Triglycerides: 193 mg/dL — ABNORMAL HIGH (ref 0–149)
VLDL Cholesterol Cal: 34 mg/dL (ref 5–40)

## 2021-07-08 LAB — CMP14+EGFR
ALT: 16 IU/L (ref 0–32)
AST: 19 IU/L (ref 0–40)
Albumin/Globulin Ratio: 1.6 (ref 1.2–2.2)
Albumin: 4.2 g/dL (ref 3.8–4.8)
Alkaline Phosphatase: 73 IU/L (ref 44–121)
BUN/Creatinine Ratio: 18 (ref 9–23)
BUN: 15 mg/dL (ref 6–24)
Bilirubin Total: 0.2 mg/dL (ref 0.0–1.2)
CO2: 19 mmol/L — ABNORMAL LOW (ref 20–29)
Calcium: 9.9 mg/dL (ref 8.7–10.2)
Chloride: 104 mmol/L (ref 96–106)
Creatinine, Ser: 0.82 mg/dL (ref 0.57–1.00)
Globulin, Total: 2.7 g/dL (ref 1.5–4.5)
Glucose: 97 mg/dL (ref 70–99)
Potassium: 4.9 mmol/L (ref 3.5–5.2)
Sodium: 140 mmol/L (ref 134–144)
Total Protein: 6.9 g/dL (ref 6.0–8.5)
eGFR: 87 mL/min/{1.73_m2} (ref >59)

## 2021-07-08 LAB — HEMOGLOBIN A1C
Est. average glucose Bld gHb Est-mCnc: 111 mg/dL
Hgb A1c MFr Bld: 5.5 % (ref 4.8–5.6)

## 2021-07-08 LAB — VITAMIN D 25 HYDROXY (VIT D DEFICIENCY, FRACTURES): Vit D, 25-Hydroxy: 21.2 ng/mL — ABNORMAL LOW (ref 30.0–100.0)

## 2021-07-23 ENCOUNTER — Other Ambulatory Visit (HOSPITAL_COMMUNITY): Payer: Self-pay

## 2021-08-31 DIAGNOSIS — L821 Other seborrheic keratosis: Secondary | ICD-10-CM | POA: Diagnosis not present

## 2021-08-31 DIAGNOSIS — D225 Melanocytic nevi of trunk: Secondary | ICD-10-CM | POA: Diagnosis not present

## 2021-10-11 ENCOUNTER — Other Ambulatory Visit (HOSPITAL_COMMUNITY): Payer: Self-pay

## 2021-11-09 IMAGING — MG MM DIGITAL DIAGNOSTIC UNILAT*R* W/ TOMO W/ CAD
6 series · 6 of 18 positions shown · non-contrast
Comparison: Previous exam(s).

CLINICAL DATA: 48-year-old female presenting as a recall from
screening for possible right breast asymmetry.

EXAM:
DIGITAL DIAGNOSTIC RIGHT MAMMOGRAM WITH TOMO
ULTRASOUND RIGHT BREAST

[R ML synth-2D]
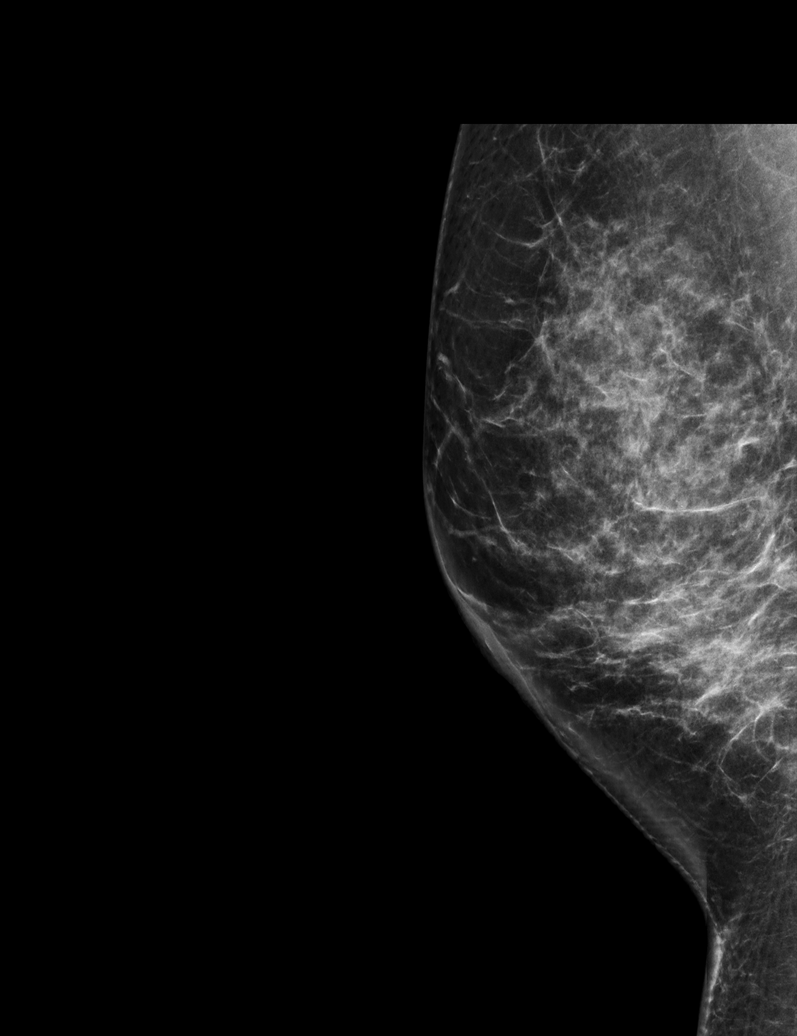

[R MLO synth-2D (1 of 2)]
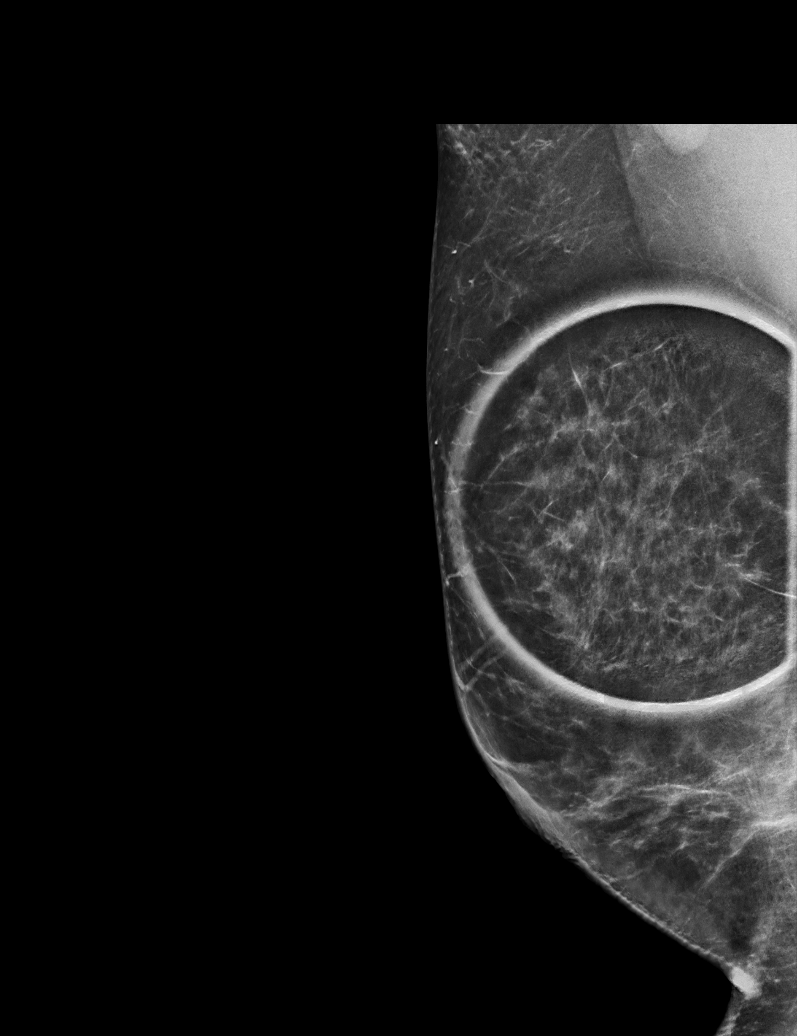

[R MLO synth-2D (2 of 2)]
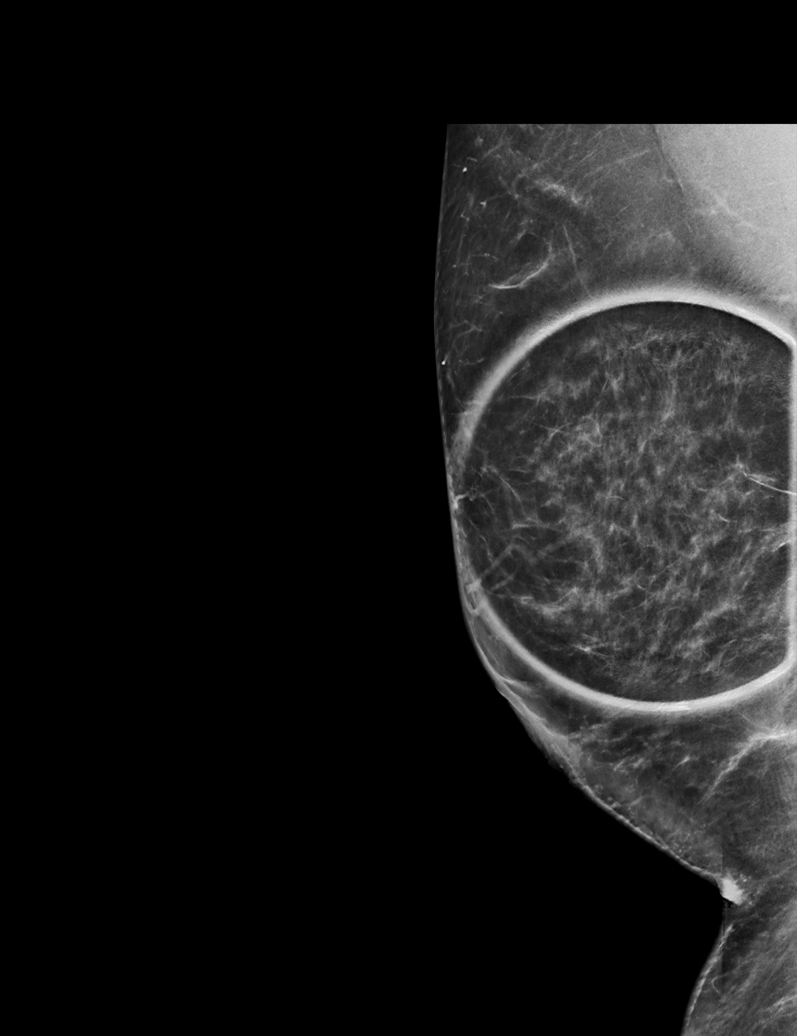

[R MLO tomo (1 of 2) · tomo slice 45/88.0]
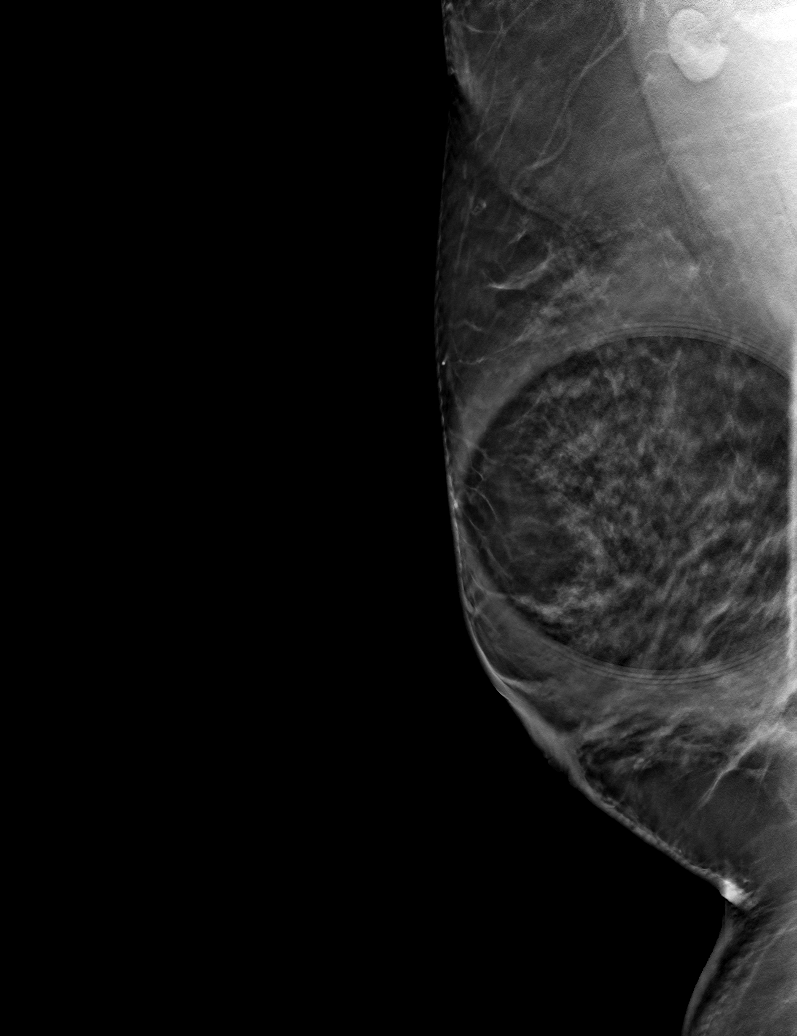

[R MLO tomo (2 of 2) · tomo slice 43/85.0]
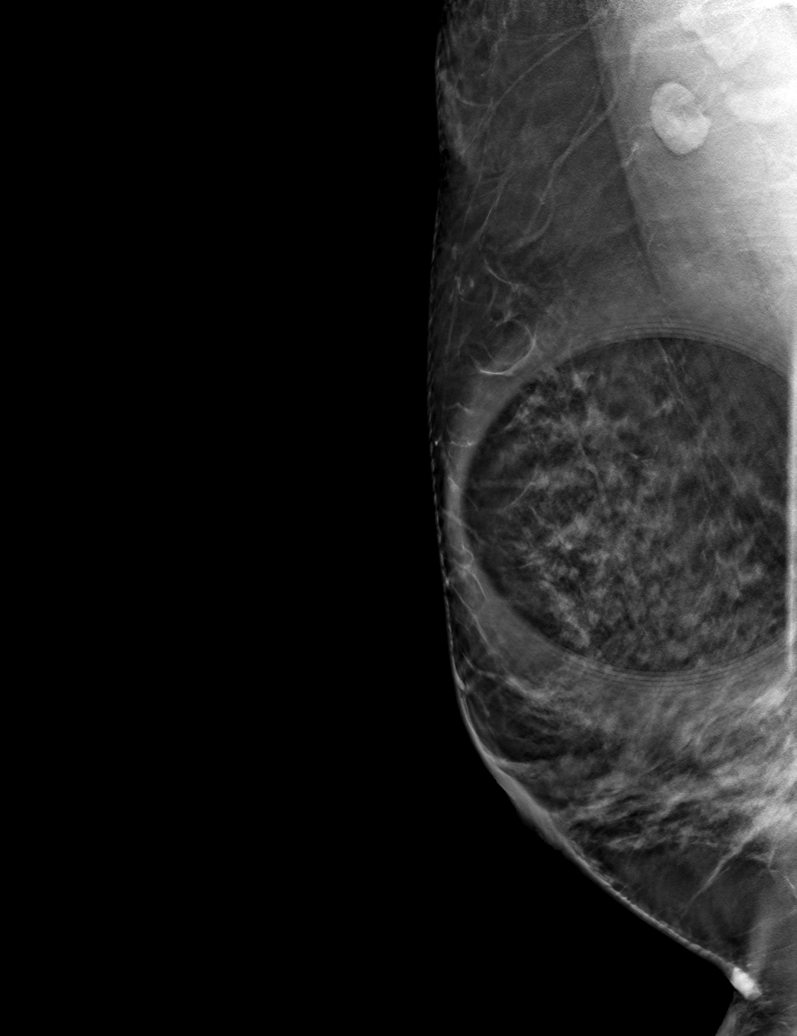

[R ML tomo · tomo slice 41/82.0]
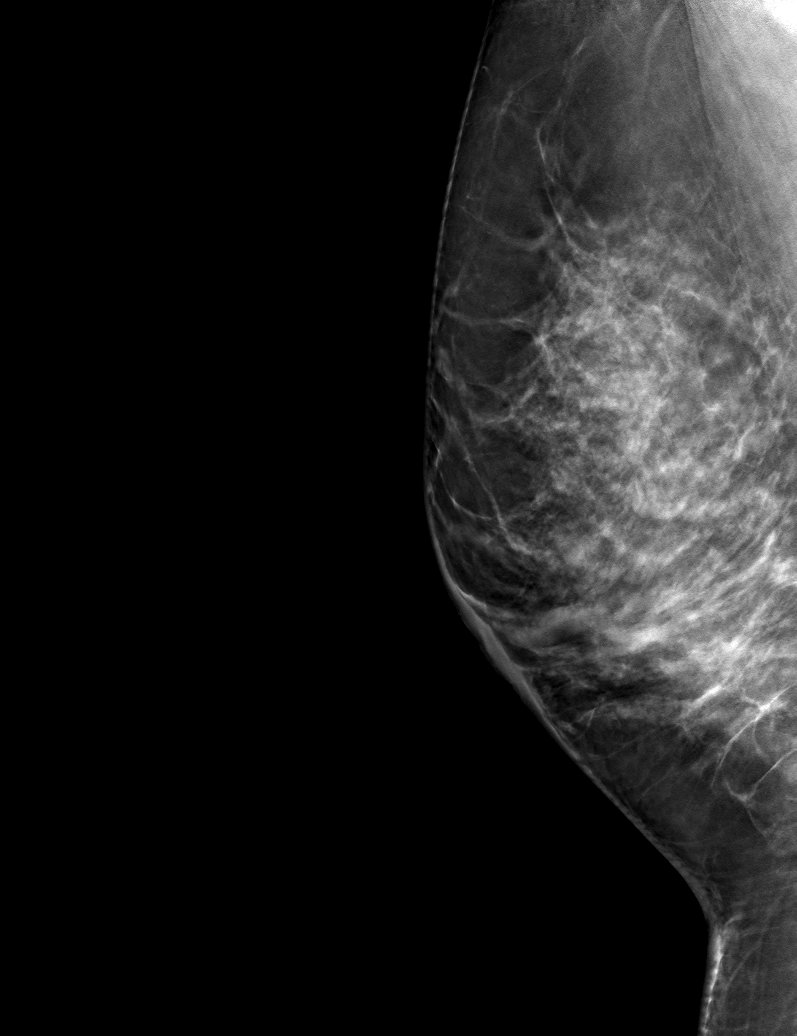

[6 of 18 positions shown; findings below may reference images not displayed]

ACR Breast Density Category c: The breast tissue is heterogeneously
dense, which may obscure small masses.
FINDINGS: Mammogram:

Spot compression tomosynthesis and full field mL views of the right
breast were performed. On the additional imaging the asymmetry seen
on screening mammogram in the superior right breast does not
persist. There is no mass or suspicious microcalcification.

Ultrasound:

Targeted ultrasound is performed throughout the superior aspect of
the right breast demonstrating no suspicious cystic or solid mass.
IMPRESSION: No mammographic or sonographic evidence of malignancy in the right
breast.

RECOMMENDATION:
Screening mammogram in one year.(Code:E0-C-O50)

I have discussed the findings and recommendations with the patient.
If applicable, a reminder letter will be sent to the patient
regarding the next appointment.

BI-RADS CATEGORY  1: Negative.

## 2021-11-25 DIAGNOSIS — J342 Deviated nasal septum: Secondary | ICD-10-CM | POA: Diagnosis not present

## 2021-11-25 DIAGNOSIS — J324 Chronic pansinusitis: Secondary | ICD-10-CM | POA: Diagnosis not present

## 2021-11-25 DIAGNOSIS — R519 Headache, unspecified: Secondary | ICD-10-CM | POA: Diagnosis not present

## 2021-11-25 DIAGNOSIS — J309 Allergic rhinitis, unspecified: Secondary | ICD-10-CM | POA: Diagnosis not present

## 2021-11-25 DIAGNOSIS — J343 Hypertrophy of nasal turbinates: Secondary | ICD-10-CM | POA: Diagnosis not present

## 2021-12-08 ENCOUNTER — Other Ambulatory Visit (HOSPITAL_COMMUNITY): Payer: Self-pay

## 2021-12-28 ENCOUNTER — Other Ambulatory Visit (HOSPITAL_COMMUNITY): Payer: Self-pay

## 2021-12-28 MED ORDER — DESOGESTREL-ETHINYL ESTRADIOL 0.15-0.02/0.01 MG (21/5) PO TABS
1.0000 | ORAL_TABLET | Freq: Every day | ORAL | 0 refills | Status: DC
  Filled 2021-12-28: qty 84, 84d supply, fill #0

## 2022-01-27 ENCOUNTER — Other Ambulatory Visit (HOSPITAL_COMMUNITY): Payer: Self-pay

## 2022-01-27 DIAGNOSIS — Z01419 Encounter for gynecological examination (general) (routine) without abnormal findings: Secondary | ICD-10-CM | POA: Diagnosis not present

## 2022-01-27 DIAGNOSIS — Z1231 Encounter for screening mammogram for malignant neoplasm of breast: Secondary | ICD-10-CM | POA: Diagnosis not present

## 2022-01-27 MED ORDER — DESOGESTREL-ETHINYL ESTRADIOL 0.15-0.02/0.01 MG (21/5) PO TABS
1.0000 | ORAL_TABLET | Freq: Every day | ORAL | 4 refills | Status: DC
Start: 2022-01-27 — End: 2023-02-23
  Filled 2022-01-27 – 2022-03-13 (×2): qty 84, 84d supply, fill #0
  Filled 2022-05-04 – 2022-05-20 (×2): qty 84, 84d supply, fill #1
  Filled 2022-08-26: qty 84, 84d supply, fill #2
  Filled 2022-11-20: qty 84, 84d supply, fill #3

## 2022-03-14 ENCOUNTER — Other Ambulatory Visit (HOSPITAL_COMMUNITY): Payer: Self-pay

## 2022-05-03 ENCOUNTER — Telehealth: Payer: Commercial Managed Care - PPO | Admitting: Physician Assistant

## 2022-05-03 ENCOUNTER — Other Ambulatory Visit (HOSPITAL_COMMUNITY): Payer: Self-pay

## 2022-05-03 DIAGNOSIS — J0191 Acute recurrent sinusitis, unspecified: Secondary | ICD-10-CM

## 2022-05-03 MED ORDER — AMOXICILLIN-POT CLAVULANATE 875-125 MG PO TABS
1.0000 | ORAL_TABLET | Freq: Two times a day (BID) | ORAL | 0 refills | Status: DC
  Filled 2022-05-03: qty 20, 10d supply, fill #0

## 2022-05-03 MED ORDER — BENZONATATE 100 MG PO CAPS
100.0000 mg | ORAL_CAPSULE | Freq: Three times a day (TID) | ORAL | 0 refills | Status: DC | PRN
  Filled 2022-05-03: qty 20, 7d supply, fill #0

## 2022-05-03 NOTE — Progress Notes (Signed)
E-Visit for Sinus Problems  We are sorry that you are not feeling well.  Here is how we plan to help!  Based on what you have shared with me it looks like you have sinusitis.  Sinusitis is inflammation and infection in the sinus cavities of the head.  Based on your presentation I believe you most likely have Acute Bacterial Sinusitis.  This is an infection caused by bacteria and is treated with antibiotics. I have prescribed Augmentin '875mg'$ /'125mg'$  one tablet twice daily with food, for 7 days. You may use an oral decongestant such as Mucinex D or if you have glaucoma or high blood pressure use plain Mucinex. Saline nasal spray help and can safely be used as often as needed for congestion.  If you develop worsening sinus pain, fever or notice severe headache and vision changes, or if symptoms are not better after completion of antibiotic, please schedule an appointment with a health care provider.    I have also sent in tessalon for you to take as needed for cough.   Sinus infections are not as easily transmitted as other respiratory infection, however we still recommend that you avoid close contact with loved ones, especially the very young and elderly.  Remember to wash your hands thoroughly throughout the day as this is the number one way to prevent the spread of infection!  Home Care: Only take medications as instructed by your medical team. Complete the entire course of an antibiotic. Do not take these medications with alcohol. A steam or ultrasonic humidifier can help congestion.  You can place a towel over your head and breathe in the steam from hot water coming from a faucet. Avoid close contacts especially the very young and the elderly. Cover your mouth when you cough or sneeze. Always remember to wash your hands.  Get Help Right Away If: You develop worsening fever or sinus pain. You develop a severe head ache or visual changes. Your symptoms persist after you have completed your  treatment plan.  Make sure you Understand these instructions. Will watch your condition. Will get help right away if you are not doing well or get worse.  Thank you for choosing an e-visit.  Your e-visit answers were reviewed by a board certified advanced clinical practitioner to complete your personal care plan. Depending upon the condition, your plan could have included both over the counter or prescription medications.  Please review your pharmacy choice. Make sure the pharmacy is open so you can pick up prescription now. If there is a problem, you may contact your provider through [employer] and have the prescription routed to another pharmacy.  Your safety is important to [place]. If you have drug allergies check your prescription carefully.   For the next 24 hours you can use MyChart to ask questions about today's visit, request a non-urgent call back, or ask for a work or school excuse. You will get an email in the next two days asking about your experience. I hope that your e-visit has been valuable and will speed your recovery.   I have spent 5 minutes in review of e-visit questionnaire, review and updating patient chart, medical decision making and response to patient.   Lenise Arena Ward, PA-C

## 2022-05-04 ENCOUNTER — Other Ambulatory Visit (HOSPITAL_COMMUNITY): Payer: Self-pay

## 2022-05-04 ENCOUNTER — Other Ambulatory Visit: Payer: Self-pay

## 2022-05-05 ENCOUNTER — Other Ambulatory Visit (HOSPITAL_COMMUNITY): Payer: Self-pay

## 2022-05-05 MED ORDER — FLUTICASONE PROPIONATE 50 MCG/ACT NA SUSP
NASAL | 11 refills | Status: AC
  Filled 2022-05-05: qty 16, 30d supply, fill #0

## 2022-05-17 ENCOUNTER — Other Ambulatory Visit (HOSPITAL_COMMUNITY): Payer: Self-pay

## 2022-05-24 ENCOUNTER — Other Ambulatory Visit: Payer: Self-pay

## 2022-07-14 ENCOUNTER — Telehealth: Payer: Commercial Managed Care - PPO | Admitting: Physician Assistant

## 2022-07-14 ENCOUNTER — Other Ambulatory Visit (HOSPITAL_COMMUNITY): Payer: Self-pay

## 2022-07-14 DIAGNOSIS — J019 Acute sinusitis, unspecified: Secondary | ICD-10-CM | POA: Diagnosis not present

## 2022-07-14 DIAGNOSIS — B9689 Other specified bacterial agents as the cause of diseases classified elsewhere: Secondary | ICD-10-CM | POA: Diagnosis not present

## 2022-07-14 MED ORDER — DOXYCYCLINE HYCLATE 100 MG PO TABS
100.0000 mg | ORAL_TABLET | Freq: Two times a day (BID) | ORAL | 0 refills | Status: DC
  Filled 2022-07-14: qty 20, 10d supply, fill #0

## 2022-07-14 NOTE — Progress Notes (Signed)
I have spent 5 minutes in review of e-visit questionnaire, review and updating patient chart, medical decision making and response to patient.   Kalsey Lull Cody Alysiah Suppa, PA-C    

## 2022-07-14 NOTE — Progress Notes (Signed)

## 2022-07-18 ENCOUNTER — Encounter: Payer: 59 | Admitting: Nurse Practitioner

## 2022-09-02 ENCOUNTER — Other Ambulatory Visit (HOSPITAL_COMMUNITY): Payer: Self-pay

## 2022-09-25 ENCOUNTER — Ambulatory Visit
Admission: RE | Admit: 2022-09-25 | Discharge: 2022-09-25 | Disposition: A | Discharge status: Home / Self Care | Payer: Commercial Managed Care - PPO | Source: Ambulatory Visit | Attending: Nurse Practitioner | Admitting: Nurse Practitioner

## 2022-09-25 VITALS — BP 154/94 | HR 92 | Temp 97.9°F | Resp 16

## 2022-09-25 DIAGNOSIS — J029 Acute pharyngitis, unspecified: Secondary | ICD-10-CM | POA: Diagnosis not present

## 2022-09-25 DIAGNOSIS — Z1152 Encounter for screening for COVID-19: Secondary | ICD-10-CM | POA: Insufficient documentation

## 2022-09-25 DIAGNOSIS — J069 Acute upper respiratory infection, unspecified: Secondary | ICD-10-CM | POA: Insufficient documentation

## 2022-09-25 LAB — POCT RAPID STREP A (OFFICE): Rapid Strep A Screen: NEGATIVE

## 2022-09-25 MED ORDER — BENZONATATE 100 MG PO CAPS: 100.0000 mg | ORAL_CAPSULE | Freq: Three times a day (TID) | ORAL | 0 refills | Status: DC | PRN

## 2022-09-25 MED ORDER — LIDOCAINE VISCOUS HCL 2 % MT SOLN: 15.0000 mL | OROMUCOSAL | 0 refills | Status: DC | PRN
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
## 2022-09-25 NOTE — ED Triage Notes (Signed)
Pt reports she has chills, body aches, sore throat, hoarse voice, and sinus pressure, x 3, days. Took sudafed and ibuprofen which gave some relief.

## 2022-09-25 NOTE — Discharge Instructions (Addendum)
Rapid strep throat test today is negative.  You have a viral upper respiratory infection.  Symptoms should improve over the next week to 10 days.  If you develop chest pain or shortness of breath, go to the emergency room.  We have tested you today for COVID-19.  You will see the results in Mychart and we will call you with positive results.  Please stay home and isolate until you are aware of the results.    Some things that can make you feel better are: - Increased rest - Increasing fluid with water/sugar free electrolytes - Acetaminophen and ibuprofen as needed for fever/pain - Lidocaine rinses, salt water gargling, chloraseptic spray and throat lozenges for sore throat - OTC guaifenesin (Mucinex) 600 mg twice daily for congestion - Saline sinus flushes or a neti pot - Humidifying the air -Tessalon Perles as needed for dry cough

## 2022-10-02 LAB — SARS CORONAVIRUS 2 (TAT 6-24 HRS): SARS Coronavirus 2: POSITIVE — AB

## 2022-10-02 LAB — CULTURE, GROUP A STREP (THRC)

## 2022-10-03 LAB — CULTURE, GROUP A STREP (THRC)

## 2022-10-04 LAB — CULTURE, GROUP A STREP (THRC)

## 2022-11-02 ENCOUNTER — Other Ambulatory Visit: Payer: Self-pay | Admitting: Oncology

## 2022-11-02 DIAGNOSIS — Z006 Encounter for examination for normal comparison and control in clinical research program: Secondary | ICD-10-CM

## 2022-11-08 ENCOUNTER — Encounter: Payer: Self-pay | Admitting: Nurse Practitioner

## 2022-11-08 ENCOUNTER — Ambulatory Visit: Payer: Commercial Managed Care - PPO | Admitting: Nurse Practitioner

## 2022-11-08 VITALS — BP 122/80 | HR 96 | Temp 98.4°F | Ht 65.8 in | Wt 184.0 lb

## 2022-11-08 DIAGNOSIS — Z8616 Personal history of COVID-19: Secondary | ICD-10-CM | POA: Diagnosis not present

## 2022-11-08 DIAGNOSIS — Z13220 Encounter for screening for lipoid disorders: Secondary | ICD-10-CM | POA: Diagnosis not present

## 2022-11-08 DIAGNOSIS — E559 Vitamin D deficiency, unspecified: Secondary | ICD-10-CM

## 2022-11-08 DIAGNOSIS — Z2821 Immunization not carried out because of patient refusal: Secondary | ICD-10-CM

## 2022-11-08 DIAGNOSIS — Z0000 Encounter for general adult medical examination without abnormal findings: Secondary | ICD-10-CM

## 2022-11-08 NOTE — Assessment & Plan Note (Signed)
Will check vitamin D level and supplement as needed.    Also encouraged to spend 15 minutes in the sun daily.   

## 2022-11-08 NOTE — Assessment & Plan Note (Signed)
Had covid 2 months ago, Doing well, no antiviral given.

## 2022-11-08 NOTE — Progress Notes (Signed)
I,Jameka J Llittleton, CMA,acting as a Neurosurgeon for [employer], FNP.,have documented all relevant documentation on the behalf of Arnette Felts, FNP,as directed by  Arnette Felts, FNP while in the presence of Arnette Felts, FNP.  Subjective:    Patient ID: Chelsea Stewart , female    DOB: 09/01/1970 , 52 y.o.   MRN: [id number]  Chief Complaint  Patient presents with   Annual Exam    HPI  Pt presents for HM. She is followed by Dr. Patricia Pesa, plan to keep on OCP until age 70. She has no specific questions or concerns at the moment. She had covid 2 months ago, did not take Paxlovid.   Wt Readings from Last 3 Encounters: 11/08/22 : 184 lb (83.5 kg) 07/07/21 : 185 lb (83.9 kg) 03/03/21 : 182 lb 6.4 oz (82.7 kg)     Past Medical History:  Diagnosis Date   Allergy    seasonal   Anxiety    Blood transfusion without reported diagnosis    1988   Hyperlipidemia      Family History  Problem Relation Age of Onset   Arthritis Mother    Hyperlipidemia Mother    Heart disease Mother 44       CABG--1st Dxed CAD   Hypertension Mother    Cancer Father    Parkinson's disease Father    Cancer Paternal Grandfather 31       Lung Cancer--Smoker   Colon cancer Neg Hx    Colon polyps Neg Hx    Esophageal cancer Neg Hx    Stomach cancer Neg Hx    Rectal cancer Neg Hx      Current Outpatient Medications:    benzonatate (TESSALON) 100 MG capsule, Take 1 capsule (100 mg total) by mouth 3 (three) times daily as needed for cough. Do not take with alcohol or while driving or operating heavy machinery.  May cause drowsiness., Disp: 21 capsule, Rfl: 0   cetirizine (ZYRTEC) 10 MG tablet, Take 10 mg by mouth at bedtime., Disp: , Rfl:    desogestrel-ethinyl estradiol (PIMTREA) 0.15-0.02/0.01 MG (21/5) tablet, Take 1 tablet by mouth daily., Disp: 84 tablet, Rfl: 4   fluticasone (FLONASE) 50 MCG/ACT nasal spray, daily as needed., Disp: , Rfl:    fluticasone (FLONASE) 50 MCG/ACT nasal spray, Place  2 sprays in  each nostril daily., Disp: 16 g, Rfl: 11   ibuprofen (ADVIL) 400 MG tablet, Take 400 mg by mouth every 6 (six) hours as needed., Disp: , Rfl:    Vitamin D, Ergocalciferol, (DRISDOL) 1.25 MG (50000 UNIT) CAPS capsule, TAKE 1 CAPSULE BY MOUTH EVERY 7 DAYS, Disp: 12 capsule, Rfl: 1   Allergies  Allergen Reactions   Clindamycin Nausea And Vomiting      The patient states she is on  OCP (estrogen/progesterone), planning to inquire about how long to continue. No LMP recorded.. Negative for Dysmenorrhea and Negative for Menorrhagia. Negative for: breast discharge, breast lump(s), breast pain and breast self exam. Associated symptoms include abnormal vaginal bleeding. Pertinent negatives include abnormal bleeding (hematology), anxiety, decreased libido, depression, difficulty falling sleep, dyspareunia, history of infertility, nocturia, sexual dysfunction, sleep disturbances, urinary incontinence, urinary urgency, vaginal discharge and vaginal itching. Diet regular; does admit to not doing well with her dietThe patient states her exercise level is moderate daily at least 30 minutes a day.   The patient's tobacco use is:  Social History   Tobacco Use  Smoking Status Never   Passive exposure: Never  Smokeless Tobacco Never  She has been exposed to passive smoke. The patient's alcohol use is:  Social History   Substance and Sexual Activity  Alcohol Use Yes   Alcohol/week: 1.0 standard drink of alcohol   Types: 1 Cans of beer per week   Additional information: Last pap by GYN will get records.   Review of Systems  Constitutional: Negative.   HENT: Negative.    Eyes: Negative.   Respiratory: Negative.    Cardiovascular: Negative.   Gastrointestinal: Negative.   Endocrine: Negative.   Genitourinary: Negative.   Musculoskeletal: Negative.   Skin: Negative.   Allergic/Immunologic: Negative.   Neurological: Negative.   Hematological: Negative.   Psychiatric/Behavioral: Negative.        Today's Vitals   11/08/22 1527  BP: 122/80  Pulse: 96  Temp: 98.4 F (36.9 C)  Weight: 184 lb (83.5 kg)  Height: 5' 5.8" (1.671 m)  PainSc: 0-No pain   Body mass index is 29.88 kg/m.  Wt Readings from Last 3 Encounters:  11/08/22 184 lb (83.5 kg)  07/07/21 185 lb (83.9 kg)  03/03/21 182 lb 6.4 oz (82.7 kg)     Objective:  Physical Exam Vitals reviewed.  Constitutional:      General: She is not in acute distress.    Appearance: Normal appearance. She is well-developed.  HENT:     Head: Normocephalic and atraumatic.     Right Ear: Hearing, tympanic membrane, ear canal and external ear normal. There is no impacted cerumen.     Left Ear: Hearing, tympanic membrane, ear canal and external ear normal. There is no impacted cerumen.     Nose: Nose normal.     Mouth/Throat:     Mouth: Mucous membranes are moist.  Eyes:     General: Lids are normal.     Extraocular Movements: Extraocular movements intact.     Conjunctiva/sclera: Conjunctivae normal.     Pupils: Pupils are equal, round, and reactive to light.     Funduscopic exam:    Right eye: No papilledema.        Left eye: No papilledema.  Neck:     Thyroid: No thyroid mass.     Vascular: No carotid bruit.  Cardiovascular:     Rate and Rhythm: Normal rate and regular rhythm.     Pulses: Normal pulses.     Heart sounds: Normal heart sounds. No murmur heard. Pulmonary:     Effort: Pulmonary effort is normal. No respiratory distress.     Breath sounds: Normal breath sounds. No wheezing.  Chest:     Chest wall: No mass.  Breasts:    Tanner Score is 5.     Right: Normal. No mass or tenderness.     Left: Normal. No mass or tenderness.  Abdominal:     General: Abdomen is flat. Bowel sounds are normal. There is no distension.     Palpations: Abdomen is soft.     Tenderness: There is no abdominal tenderness.  Musculoskeletal:        General: No swelling. Normal range of motion.     Cervical back: Full passive range of  motion without pain, normal range of motion and neck supple. No tenderness.     Right lower leg: No edema.     Left lower leg: No edema.  Lymphadenopathy:     Upper Body:     Right upper body: No supraclavicular, axillary or pectoral adenopathy.     Left upper body: No supraclavicular, axillary or pectoral adenopathy.  Skin:    General: Skin is warm and dry.     Capillary Refill: Capillary refill takes less than 2 seconds.  Neurological:     General: No focal deficit present.     Mental Status: She is alert and oriented to person, place, and time.     Cranial Nerves: No cranial nerve deficit.     Sensory: No sensory deficit.     Motor: No weakness.  Psychiatric:        Mood and Affect: Mood normal.        Behavior: Behavior normal.        Thought Content: Thought content normal.        Judgment: Judgment normal.         Assessment And Plan:     Encounter for general adult medical examination w/o abnormal findings Assessment & Plan: Behavior modifications discussed and diet history reviewed.   Pt will continue to exercise regularly and modify diet with low GI, plant based foods and decrease intake of processed foods.  Recommend intake of daily multivitamin, Vitamin D, and calcium.  Recommend mammogram and colonoscopy for preventive screenings, as well as recommend immunizations that include influenza, TDAP (will get records [facility]), and Shingles    Orders: -     Basic metabolic panel -     CBC  Herpes zoster vaccination declined Assessment & Plan: Declines shingrix, educated on disease process and is aware if he changes his mind to notify office    Encounter for screening for lipid disorder -     Lipid panel  Vitamin D deficiency Assessment & Plan: Will check vitamin D level and supplement as needed.    Also encouraged to spend 15 minutes in the sun daily.     Orders: -     VITAMIN D 25 Hydroxy (Vit-D Deficiency, Fractures)  History of  COVID-19 Assessment & Plan: Had covid 2 months ago, Doing well, no antiviral given.     Return for 1 year physical.  Patient was given opportunity to ask questions. Patient verbalized understanding of the plan and was able to repeat key elements of the plan. All questions were answered to their satisfaction.   Arnette Felts, FNP   I, Arnette Felts, FNP, have reviewed all documentation for this visit. The documentation on 11/08/22 for the exam, diagnosis, procedures, and orders are all accurate and complete.

## 2022-11-08 NOTE — Assessment & Plan Note (Signed)
Declines shingrix, educated on disease process and is aware if he changes his mind to notify office  

## 2022-11-08 NOTE — Assessment & Plan Note (Signed)
Behavior modifications discussed and diet history reviewed.   Pt will continue to exercise regularly and modify diet with low GI, plant based foods and decrease intake of processed foods.  Recommend intake of daily multivitamin, Vitamin D, and calcium.  Recommend mammogram and colonoscopy for preventive screenings, as well as recommend immunizations that include influenza, TDAP (will get records from [facility] health at [facility]), and Shingles

## 2022-11-09 ENCOUNTER — Other Ambulatory Visit (HOSPITAL_COMMUNITY)
Admission: RE | Admit: 2022-11-09 | Discharge: 2022-11-09 | Disposition: A | Discharge status: Home / Self Care | Payer: Commercial Managed Care - PPO | Source: Ambulatory Visit | Attending: Nurse Practitioner | Admitting: Nurse Practitioner

## 2022-11-09 DIAGNOSIS — Z0000 Encounter for general adult medical examination without abnormal findings: Secondary | ICD-10-CM | POA: Diagnosis present

## 2022-11-09 DIAGNOSIS — E559 Vitamin D deficiency, unspecified: Secondary | ICD-10-CM | POA: Diagnosis present

## 2022-11-09 DIAGNOSIS — Z006 Encounter for examination for normal comparison and control in clinical research program: Secondary | ICD-10-CM | POA: Diagnosis not present

## 2022-11-09 DIAGNOSIS — Z13220 Encounter for screening for lipoid disorders: Secondary | ICD-10-CM | POA: Diagnosis present

## 2022-11-09 LAB — BASIC METABOLIC PANEL
Anion gap: 11 (ref 5–15)
BUN: 17 mg/dL (ref 6–20)
CO2: 14 mmol/L — ABNORMAL LOW (ref 22–32)
Calcium: 8.9 mg/dL (ref 8.9–10.3)
Chloride: 111 mmol/L (ref 98–111)
Creatinine, Ser: 0.78 mg/dL (ref 0.44–1.00)
GFR, Estimated: >60 mL/min (ref >60)
Glucose, Bld: 104 mg/dL — ABNORMAL HIGH (ref 70–99)
Potassium: 3.8 mmol/L (ref 3.5–5.1)
Sodium: 136 mmol/L (ref 135–145)

## 2022-11-09 LAB — LIPID PANEL
Cholesterol: 206 mg/dL — ABNORMAL HIGH (ref 0–200)
HDL: 58 mg/dL (ref >40)
LDL Cholesterol: 107 mg/dL — ABNORMAL HIGH (ref 0–99)
Total CHOL/HDL Ratio: 3.6 RATIO
Triglycerides: 204 mg/dL — ABNORMAL HIGH (ref <150)
VLDL: 41 mg/dL — ABNORMAL HIGH (ref 0–40)

## 2022-11-09 LAB — CBC
HCT: 40.5 % (ref 36.0–46.0)
Hemoglobin: 13 g/dL (ref 12.0–15.0)
MCH: 29.5 pg (ref 26.0–34.0)
MCHC: 32.1 g/dL (ref 30.0–36.0)
MCV: 92 fL (ref 80.0–100.0)
Platelets: 340 10*3/uL (ref 150–400)
RBC: 4.4 MIL/uL (ref 3.87–5.11)
RDW: 13.2 % (ref 11.5–15.5)
WBC: 9 10*3/uL (ref 4.0–10.5)
nRBC: 0 % (ref 0.0–0.2)

## 2022-11-09 LAB — VITAMIN D 25 HYDROXY (VIT D DEFICIENCY, FRACTURES): Vit D, 25-Hydroxy: 29.88 ng/mL — ABNORMAL LOW (ref 30–100)

## 2022-11-25 ENCOUNTER — Other Ambulatory Visit (HOSPITAL_COMMUNITY): Payer: Self-pay

## 2023-01-12 ENCOUNTER — Other Ambulatory Visit (HOSPITAL_COMMUNITY): Payer: Self-pay

## 2023-01-12 MED ORDER — FLUCONAZOLE 150 MG PO TABS
ORAL_TABLET | ORAL | 0 refills | Status: DC
  Filled 2023-01-12: qty 2, 2d supply, fill #0

## 2023-02-22 DIAGNOSIS — Z01419 Encounter for gynecological examination (general) (routine) without abnormal findings: Secondary | ICD-10-CM | POA: Diagnosis not present

## 2023-02-22 DIAGNOSIS — Z1231 Encounter for screening mammogram for malignant neoplasm of breast: Secondary | ICD-10-CM | POA: Diagnosis not present

## 2023-02-22 LAB — HM MAMMOGRAPHY

## 2023-02-23 ENCOUNTER — Other Ambulatory Visit (HOSPITAL_COMMUNITY): Payer: Self-pay

## 2023-02-23 MED ORDER — DESOGESTREL-ETHINYL ESTRADIOL 0.15-0.02/0.01 MG (21/5) PO TABS
1.0000 | ORAL_TABLET | Freq: Every day | ORAL | 4 refills | Status: DC
  Filled 2023-02-23: qty 84, 84d supply, fill #0
  Filled 2023-05-13: qty 84, 84d supply, fill #1
  Filled 2023-08-05: qty 84, 84d supply, fill #2
  Filled 2023-10-28: qty 84, 84d supply, fill #3
  Filled 2024-01-18: qty 84, 84d supply, fill #4

## 2023-06-28 DIAGNOSIS — L7211 Pilar cyst: Secondary | ICD-10-CM | POA: Diagnosis not present

## 2023-06-28 DIAGNOSIS — L578 Other skin changes due to chronic exposure to nonionizing radiation: Secondary | ICD-10-CM | POA: Diagnosis not present

## 2023-06-28 DIAGNOSIS — D225 Melanocytic nevi of trunk: Secondary | ICD-10-CM | POA: Diagnosis not present

## 2023-06-28 DIAGNOSIS — L820 Inflamed seborrheic keratosis: Secondary | ICD-10-CM | POA: Diagnosis not present

## 2023-06-28 DIAGNOSIS — L821 Other seborrheic keratosis: Secondary | ICD-10-CM | POA: Diagnosis not present

## 2023-08-07 ENCOUNTER — Other Ambulatory Visit: Payer: Self-pay

## 2023-08-14 ENCOUNTER — Telehealth: Admitting: Physician Assistant

## 2023-08-14 ENCOUNTER — Other Ambulatory Visit (HOSPITAL_COMMUNITY): Payer: Self-pay

## 2023-08-14 DIAGNOSIS — J069 Acute upper respiratory infection, unspecified: Secondary | ICD-10-CM | POA: Diagnosis not present

## 2023-08-14 MED ORDER — ALBUTEROL SULFATE HFA 108 (90 BASE) MCG/ACT IN AERS
1.0000 | INHALATION_SPRAY | Freq: Four times a day (QID) | RESPIRATORY_TRACT | 0 refills | Status: DC | PRN
Start: 2023-08-14 — End: 2023-08-25
  Filled 2023-08-14: qty 6.7, 25d supply, fill #0

## 2023-08-14 MED ORDER — PSEUDOEPH-BROMPHEN-DM 30-2-10 MG/5ML PO SYRP
5.0000 mL | ORAL_SOLUTION | Freq: Four times a day (QID) | ORAL | 0 refills | Status: DC | PRN
Start: 2023-08-14 — End: 2023-08-25
  Filled 2023-08-14: qty 120, 6d supply, fill #0

## 2023-08-14 MED ORDER — AZELASTINE HCL 0.1 % NA SOLN
1.0000 | Freq: Two times a day (BID) | NASAL | 0 refills | Status: DC
Start: 2023-08-14 — End: 2023-08-25
  Filled 2023-08-14: qty 30, 30d supply, fill #0

## 2023-08-14 NOTE — Progress Notes (Signed)
 E-Visit for Upper Respiratory Infection   We are sorry you are not feeling well.  Here is how we plan to help!  Based on what you have shared with me, it looks like you may have a viral upper respiratory infection.  Upper respiratory infections are caused by a large number of viruses; however, rhinovirus is the most common cause.   Symptoms vary from person to person, with common symptoms including sore throat, cough, fatigue or lack of energy and feeling of general discomfort.  A low-grade fever of up to 100.4 may present, but is often uncommon.  Symptoms vary however, and are closely related to a person's age or underlying illnesses.  The most common symptoms associated with an upper respiratory infection are nasal discharge or congestion, cough, sneezing, headache and pressure in the ears and face.  These symptoms usually persist for about 3 to 10 days, but can last up to 2 weeks.  It is important to know that upper respiratory infections do not cause serious illness or complications in most cases.    Upper respiratory infections can be transmitted from person to person, with the most common method of transmission being a person's hands.  The virus is able to live on the skin and can infect other persons for up to 2 hours after direct contact.  Also, these can be transmitted when someone coughs or sneezes; thus, it is important to cover the mouth to reduce this risk.  To keep the spread of the illness at bay, good hand hygiene is very important.  This is an infection that is most likely caused by a virus. There are no specific treatments other than to help you with the symptoms until the infection runs its course.  We are sorry you are not feeling well.  Here is how we plan to help!   For nasal congestion, you may use an oral decongestants such as Mucinex  D or if you have glaucoma or high blood pressure use plain Mucinex .  Saline nasal spray or nasal drops can help and can safely be used as often as  needed for congestion.  For your congestion, I have prescribed Azelastine  nasal spray two sprays in each nostril twice a day  If you do not have a history of heart disease, hypertension, diabetes or thyroid disease, prostate/bladder issues or glaucoma, you may also use Sudafed to treat nasal congestion.  It is highly recommended that you consult with a pharmacist or your primary care physician to ensure this medication is safe for you to take.     If you have a cough, you may use cough suppressants such as Delsym and Robitussin.  If you have glaucoma or high blood pressure, you can also use Coricidin HBP.   For cough I have prescribed for you Bromfed DM cough syrup Take 5mL every 6 hours as needed for cough and Albuterol  inhaler Use 1-2 puffs every 6 hours as needed for shortness of breath, chest tightness, and/or wheezing.  If you have a sore or scratchy throat, use a saltwater gargle-  to  teaspoon of salt dissolved in a 4-ounce to 8-ounce glass of warm water.  Gargle the solution for approximately 15-30 seconds and then spit.  It is important not to swallow the solution.  You can also use throat lozenges/cough drops and Chloraseptic spray to help with throat pain or discomfort.  Warm or cold liquids can also be helpful in relieving throat pain.  For headache, pain or general discomfort, you can  use Ibuprofen  or Tylenol  as directed.   Some authorities believe that zinc sprays or the use of Echinacea may shorten the course of your symptoms.   HOME CARE Only take medications as instructed by your medical team. Be sure to drink plenty of fluids. Water is fine as well as fruit juices, sodas and electrolyte beverages. You may want to stay away from caffeine or alcohol. If you are nauseated, try taking small sips of liquids. How do you know if you are getting enough fluid? Your urine should be a pale yellow or almost colorless. Get rest. Taking a steamy shower or using a humidifier may help nasal  congestion and ease sore throat pain. You can place a towel over your head and breathe in the steam from hot water coming from a faucet. Using a saline nasal spray works much the same way. Cough drops, hard candies and sore throat lozenges may ease your cough. Avoid close contacts especially the very young and the elderly Cover your mouth if you cough or sneeze Always remember to wash your hands.   GET HELP RIGHT AWAY IF: You develop worsening fever. If your symptoms do not improve within 10 days You develop yellow or green discharge from your nose over 3 days. You have coughing fits You develop a severe head ache or visual changes. You develop shortness of breath, difficulty breathing or start having chest pain Your symptoms persist after you have completed your treatment plan  MAKE SURE YOU  Understand these instructions. Will watch your condition. Will get help right away if you are not doing well or get worse.  Thank you for choosing an e-visit.  Your e-visit answers were reviewed by a board certified advanced clinical practitioner to complete your personal care plan. Depending upon the condition, your plan could have included both over the counter or prescription medications.  Please review your pharmacy choice. Make sure the pharmacy is open so you can pick up prescription now. If there is a problem, you may contact your provider through [employer] and have the prescription routed to another pharmacy.  Your safety is important to us . If you have drug allergies check your prescription carefully.   For the next 24 hours you can use MyChart to ask questions about today's visit, request a non-urgent call back, or ask for a work or school excuse. You will get an email in the next two days asking about your experience. I hope that your e-visit has been valuable and will speed your recovery.    I have spent 5 minutes in review of e-visit questionnaire, review and updating  patient chart, medical decision making and response to patient.   Angelia Kelp, PA-C

## 2023-08-24 ENCOUNTER — Ambulatory Visit: Admitting: Nurse Practitioner

## 2023-08-25 ENCOUNTER — Other Ambulatory Visit (HOSPITAL_COMMUNITY): Payer: Self-pay

## 2023-08-25 ENCOUNTER — Telehealth: Admitting: Physician Assistant

## 2023-08-25 DIAGNOSIS — B9689 Other specified bacterial agents as the cause of diseases classified elsewhere: Secondary | ICD-10-CM | POA: Diagnosis not present

## 2023-08-25 DIAGNOSIS — J019 Acute sinusitis, unspecified: Secondary | ICD-10-CM

## 2023-08-25 MED ORDER — AMOXICILLIN-POT CLAVULANATE 875-125 MG PO TABS
1.0000 | ORAL_TABLET | Freq: Two times a day (BID) | ORAL | 0 refills | Status: DC
Start: 2023-08-25 — End: 2023-11-15
  Filled 2023-08-25: qty 14, 7d supply, fill #0

## 2023-08-25 NOTE — Progress Notes (Signed)

## 2023-11-15 ENCOUNTER — Ambulatory Visit (INDEPENDENT_AMBULATORY_CARE_PROVIDER_SITE_OTHER): Payer: Commercial Managed Care - PPO | Admitting: Nurse Practitioner

## 2023-11-15 ENCOUNTER — Encounter: Payer: Self-pay | Admitting: Nurse Practitioner

## 2023-11-15 VITALS — BP 100/70 | HR 84 | Temp 97.8°F | Ht 65.0 in | Wt 189.2 lb

## 2023-11-15 DIAGNOSIS — Z6831 Body mass index (BMI) 31.0-31.9, adult: Secondary | ICD-10-CM | POA: Diagnosis not present

## 2023-11-15 DIAGNOSIS — E559 Vitamin D deficiency, unspecified: Secondary | ICD-10-CM | POA: Diagnosis not present

## 2023-11-15 DIAGNOSIS — E785 Hyperlipidemia, unspecified: Secondary | ICD-10-CM | POA: Diagnosis not present

## 2023-11-15 DIAGNOSIS — Z23 Encounter for immunization: Secondary | ICD-10-CM | POA: Diagnosis not present

## 2023-11-15 DIAGNOSIS — E66811 Obesity, class 1: Secondary | ICD-10-CM | POA: Diagnosis not present

## 2023-11-15 DIAGNOSIS — Z2821 Immunization not carried out because of patient refusal: Secondary | ICD-10-CM | POA: Diagnosis not present

## 2023-11-15 DIAGNOSIS — Z0000 Encounter for general adult medical examination without abnormal findings: Secondary | ICD-10-CM | POA: Insufficient documentation

## 2023-11-15 DIAGNOSIS — E6609 Other obesity due to excess calories: Secondary | ICD-10-CM | POA: Diagnosis not present

## 2023-11-15 NOTE — Assessment & Plan Note (Signed)
 Cholesterol levels were elevated at last visit. Continue focusing on low fat diet. Will recheck lipid panel.

## 2023-11-15 NOTE — Assessment & Plan Note (Signed)
 Will check vitamin D level and supplement as needed.    Also encouraged to spend 15 minutes in the sun daily.

## 2023-11-15 NOTE — Assessment & Plan Note (Addendum)
 She is encouraged to strive for BMI less than 30 to decrease cardiac risk. Advised to aim for at least 150 minutes of exercise per week. Her weight has increased slightly however she remains active.

## 2023-11-15 NOTE — Progress Notes (Signed)
 LILLETTE Kristeen JINNY Gladis, CMA,acting as a Neurosurgeon for Gaines Ada, FNP.,have documented all relevant documentation on the behalf of Gaines Ada, FNP,as directed by  Gaines Ada, FNP while in the presence of Gaines Ada, FNP.  Subjective:    Patient ID: Chelsea Stewart , female    DOB: 08-20-1970 , 53 y.o.   MRN: [id number]  Chief Complaint  Patient presents with   Annual Exam    Patient presents today for HM, Patient reports compliance with medication. Patient denies any chest pain, SOB, or headaches. Patient has no concerns today.     HPI  Here for HM. She has seen Dr. Marjorie Gull GYN no PAP.      Past Medical History:  Diagnosis Date   Allergy    seasonal   Anxiety    Blood transfusion without reported diagnosis    1988   Hyperlipidemia      Family History  Problem Relation Age of Onset   Arthritis Mother    Hyperlipidemia Mother    Heart disease Mother 20       CABG--1st Dxed CAD   Hypertension Mother    Cancer Father    Parkinson's disease Father    Cancer Paternal Grandfather 81       Lung Cancer--Smoker   Cancer Paternal Aunt    Colon cancer Neg Hx    Colon polyps Neg Hx    Esophageal cancer Neg Hx    Stomach cancer Neg Hx    Rectal cancer Neg Hx      Current Outpatient Medications:    cetirizine (ZYRTEC) 10 MG tablet, Take 10 mg by mouth at bedtime., Disp: , Rfl:    desogestrel -ethinyl estradiol  (PIMTREA ) 0.15-0.02/0.01 MG (21/5) tablet, Take 1 tablet by mouth daily., Disp: 84 tablet, Rfl: 4   fluticasone  (FLONASE ) 50 MCG/ACT nasal spray, Place  2 sprays in each nostril daily., Disp: 16 g, Rfl: 11   ibuprofen  (ADVIL ) 400 MG tablet, Take 400 mg by mouth every 6 (six) hours as needed., Disp: , Rfl:    Allergies  Allergen Reactions   Clindamycin Nausea And Vomiting   Erythromycin Nausea Only, Other (See Comments) and Nausea And Vomiting      The patient states she uses OCP (estrogen/progesterone) for birth control. Patient's last menstrual period was 11/06/2023  (approximate).. Negative for Dysmenorrhea and Negative for Menorrhagia. Negative for: breast discharge, breast lump(s), breast pain and breast self exam. Associated symptoms include abnormal vaginal bleeding. Pertinent negatives include abnormal bleeding (hematology), anxiety, decreased libido, depression, difficulty falling sleep, dyspareunia, history of infertility, nocturia, sexual dysfunction, sleep disturbances, urinary incontinence, urinary urgency, vaginal discharge and vaginal itching. Diet regular. She is trying to avoid sugar but having some difficulties. The patient states her exercise level is moderate 5 days a week.   The patient's tobacco use is:  Social History   Tobacco Use  Smoking Status Never   Passive exposure: Never  Smokeless Tobacco Never  . She has been exposed to passive smoke. The patient's alcohol use is:  Social History   Substance and Sexual Activity  Alcohol Use Yes   Alcohol/week: 1.0 standard drink of alcohol   Types: 1 Cans of beer per week   Additional information: Last pap 2022, next one scheduled for 2025.    Review of Systems  Constitutional: Negative.   HENT: Negative.    Eyes: Negative.   Respiratory: Negative.    Cardiovascular: Negative.   Gastrointestinal: Negative.   Endocrine: Negative.   Genitourinary: Negative.  Musculoskeletal: Negative.   Skin: Negative.   Allergic/Immunologic: Negative.   Neurological: Negative.   Hematological: Negative.   Psychiatric/Behavioral: Negative.       Today's Vitals   11/15/23 0834  BP: 100/70  Pulse: 84  Temp: 97.8 F (36.6 C)  TempSrc: Oral  Weight: 189 lb 3.2 oz (85.8 kg)  Height: 5' 5 (1.651 m)  PainSc: 0-No pain   Body mass index is 31.48 kg/m.  Wt Readings from Last 3 Encounters:  11/15/23 189 lb 3.2 oz (85.8 kg)  11/08/22 184 lb (83.5 kg)  07/07/21 185 lb (83.9 kg)     Objective:  Physical Exam Vitals and nursing note reviewed.  Constitutional:      General: She is not in  acute distress.    Appearance: Normal appearance. She is well-developed.  HENT:     Head: Normocephalic and atraumatic.     Right Ear: Hearing, tympanic membrane, ear canal and external ear normal. There is no impacted cerumen.     Left Ear: Hearing, tympanic membrane, ear canal and external ear normal. There is no impacted cerumen.     Nose: Nose normal.     Mouth/Throat:     Mouth: Mucous membranes are moist.  Eyes:     General: Lids are normal.     Extraocular Movements: Extraocular movements intact.     Conjunctiva/sclera: Conjunctivae normal.     Pupils: Pupils are equal, round, and reactive to light.     Funduscopic exam:    Right eye: No papilledema.        Left eye: No papilledema.  Neck:     Thyroid: No thyroid mass.     Vascular: No carotid bruit.  Cardiovascular:     Rate and Rhythm: Normal rate and regular rhythm.     Pulses: Normal pulses.     Heart sounds: Normal heart sounds. No murmur heard. Pulmonary:     Effort: Pulmonary effort is normal. No respiratory distress.     Breath sounds: Normal breath sounds. No wheezing.  Chest:     Chest wall: No mass.  Breasts:    Tanner Score is 5.     Right: Normal. No mass or tenderness.     Left: Normal. No mass or tenderness.  Abdominal:     General: Abdomen is flat. Bowel sounds are normal. There is no distension.     Palpations: Abdomen is soft.     Tenderness: There is no abdominal tenderness.  Musculoskeletal:        General: No swelling. Normal range of motion.     Cervical back: Full passive range of motion without pain, normal range of motion and neck supple. No tenderness.     Right lower leg: No edema.     Left lower leg: No edema.  Lymphadenopathy:     Upper Body:     Right upper body: No supraclavicular, axillary or pectoral adenopathy.     Left upper body: No supraclavicular, axillary or pectoral adenopathy.  Skin:    General: Skin is warm and dry.     Capillary Refill: Capillary refill takes less than  2 seconds.  Neurological:     General: No focal deficit present.     Mental Status: She is alert and oriented to person, place, and time.     Cranial Nerves: No cranial nerve deficit.     Sensory: No sensory deficit.     Motor: No weakness.  Psychiatric:        Mood  and Affect: Mood normal.        Behavior: Behavior normal.        Thought Content: Thought content normal.        Judgment: Judgment normal.      Assessment And Plan:     Encounter for annual health examination Assessment & Plan: Behavior modifications discussed and diet history reviewed.   Pt will continue to exercise regularly and modify diet with low GI, plant based foods and decrease intake of processed foods.  Recommend intake of daily multivitamin, Vitamin D , and calcium.  Recommend mammogram and colonoscopy for preventive screenings, as well as recommend immunizations that include influenza, TDAP, and Shingles (1st one given today), she will provide us  with a copy of her hepatitis b series.   Orders: -     CBC with Differential/Platelet -     CMP14+EGFR  Vitamin D  deficiency Assessment & Plan: Will check vitamin D  level and supplement as needed.    Also encouraged to spend 15 minutes in the sun daily.     Orders: -     VITAMIN D  25 Hydroxy (Vit-D Deficiency, Fractures)  Hyperlipidemia, unspecified hyperlipidemia type Assessment & Plan: Cholesterol levels were elevated at last visit. Continue focusing on low fat diet. Will recheck lipid panel.   Orders: -     Lipid panel  Tetanus, diphtheria, and acellular pertussis (Tdap) vaccination declined  COVID-19 vaccination declined Assessment & Plan: Declines covid 19 vaccine. Discussed risk of covid 6 and if she changes her mind about the vaccine to call the office. Education has been provided regarding the importance of this vaccine but patient still declined. Advised may receive this vaccine at local pharmacy or Health Dept.or vaccine clinic. Aware to  provide a copy of the vaccination record if obtained from local pharmacy or Health Dept.  Encouraged to take multivitamin, vitamin d , vitamin c and zinc to increase immune system. Aware can call office if would like to have vaccine here at office. Verbalized acceptance and understanding.    Need for shingles vaccine -     Varicella-zoster vaccine IM  Class 1 obesity due to excess calories with body mass index (BMI) of 31.0 to 31.9 in adult, unspecified whether serious comorbidity present Assessment & Plan: She is encouraged to strive for BMI less than 30 to decrease cardiac risk. Advised to aim for at least 150 minutes of exercise per week. Her weight has increased slightly however she remains active.    Orders: -     Hemoglobin A1c    Return for 1 year physical, 6 month chol check. Patient was given opportunity to ask questions. Patient verbalized understanding of the plan and was able to repeat key elements of the plan. All questions were answered to their satisfaction.   Gaines Ada, FNP  I, Gaines Ada, FNP, have reviewed all documentation for this visit. The documentation on 11/15/23 for the exam, diagnosis, procedures, and orders are all accurate and complete.

## 2023-11-15 NOTE — Assessment & Plan Note (Signed)

## 2023-11-15 NOTE — Patient Instructions (Signed)
 Health Maintenance  Topic Date Due   Hepatitis B Vaccine (2 of 3 - 19+ 3-dose series) 05/24/2007   Mammogram  10/01/2019   Pap with HPV screening  12/08/2023   COVID-19 Vaccine (4 - 2024-25 season) 12/01/2023*   DTaP/Tdap/Td vaccine (2 - Tdap) 11/14/2024*   Flu Shot  11/24/2023   Zoster (Shingles) Vaccine (2 of 2) 01/10/2024   Colon Cancer Screening  01/02/2031   Hepatitis C Screening  Completed   HIV Screening  Completed   HPV Vaccine  Aged Out   Meningitis B Vaccine  Aged Out  *Topic was postponed. The date shown is not the original due date.

## 2023-11-15 NOTE — Assessment & Plan Note (Addendum)
 Behavior modifications discussed and diet history reviewed.   Pt will continue to exercise regularly and modify diet with low GI, plant based foods and decrease intake of processed foods.  Recommend intake of daily multivitamin, Vitamin D , and calcium.  Recommend mammogram and colonoscopy for preventive screenings, as well as recommend immunizations that include influenza, TDAP, and Shingles (1st one given today), she will provide us  with a copy of her hepatitis b series.

## 2023-11-16 LAB — CMP14+EGFR
ALT: 14 IU/L (ref 0–32)
AST: 21 IU/L (ref 0–40)
Albumin: 4.1 g/dL (ref 3.8–4.9)
Alkaline Phosphatase: 81 IU/L (ref 44–121)
BUN/Creatinine Ratio: 19 (ref 9–23)
BUN: 15 mg/dL (ref 6–24)
Bilirubin Total: 0.3 mg/dL (ref 0.0–1.2)
CO2: 17 mmol/L — ABNORMAL LOW (ref 20–29)
Calcium: 9.6 mg/dL (ref 8.7–10.2)
Chloride: 105 mmol/L (ref 96–106)
Creatinine, Ser: 0.8 mg/dL (ref 0.57–1.00)
Globulin, Total: 3 g/dL (ref 1.5–4.5)
Glucose: 94 mg/dL (ref 70–99)
Potassium: 4.9 mmol/L (ref 3.5–5.2)
Sodium: 140 mmol/L (ref 134–144)
Total Protein: 7.1 g/dL (ref 6.0–8.5)
eGFR: 89 mL/min/1.73 (ref >59)

## 2023-11-16 LAB — CBC WITH DIFFERENTIAL/PLATELET
Basophils Absolute: 0.1 x10E3/uL (ref 0.0–0.2)
Basos: 1 %
EOS (ABSOLUTE): 0.2 x10E3/uL (ref 0.0–0.4)
Eos: 2 %
Hematocrit: 38.6 % (ref 34.0–46.6)
Hemoglobin: 13 g/dL (ref 11.1–15.9)
Immature Grans (Abs): 0 x10E3/uL (ref 0.0–0.1)
Immature Granulocytes: 0 %
Lymphocytes Absolute: 2.4 x10E3/uL (ref 0.7–3.1)
Lymphs: 27 %
MCH: 30.8 pg (ref 26.6–33.0)
MCHC: 33.7 g/dL (ref 31.5–35.7)
MCV: 92 fL (ref 79–97)
Monocytes Absolute: 0.7 x10E3/uL (ref 0.1–0.9)
Monocytes: 9 %
Neutrophils Absolute: 5.3 x10E3/uL (ref 1.4–7.0)
Neutrophils: 60 %
Platelets: 368 x10E3/uL (ref 150–450)
RBC: 4.22 x10E6/uL (ref 3.77–5.28)
RDW: 13.1 % (ref 11.7–15.4)
WBC: 8.7 x10E3/uL (ref 3.4–10.8)

## 2023-11-16 LAB — LIPID PANEL
Chol/HDL Ratio: 3.6 ratio (ref 0.0–4.4)
Cholesterol, Total: 182 mg/dL (ref 100–199)
HDL: 51 mg/dL (ref >39)
LDL Chol Calc (NIH): 99 mg/dL (ref 0–99)
Triglycerides: 184 mg/dL — ABNORMAL HIGH (ref 0–149)
VLDL Cholesterol Cal: 32 mg/dL (ref 5–40)

## 2023-11-16 LAB — VITAMIN D 25 HYDROXY (VIT D DEFICIENCY, FRACTURES): Vit D, 25-Hydroxy: 23.3 ng/mL — ABNORMAL LOW (ref 30.0–100.0)

## 2023-11-16 LAB — HEMOGLOBIN A1C
Est. average glucose Bld gHb Est-mCnc: 108 mg/dL
Hgb A1c MFr Bld: 5.4 % (ref 4.8–5.6)

## 2023-12-01 ENCOUNTER — Ambulatory Visit

## 2023-12-20 ENCOUNTER — Encounter: Payer: Self-pay | Admitting: Nurse Practitioner

## 2024-01-26 ENCOUNTER — Ambulatory Visit

## 2024-02-09 ENCOUNTER — Ambulatory Visit (INDEPENDENT_AMBULATORY_CARE_PROVIDER_SITE_OTHER): Payer: Self-pay

## 2024-02-09 VITALS — BP 130/80 | HR 98 | Temp 98.0°F | Ht 65.0 in | Wt 189.0 lb

## 2024-02-09 DIAGNOSIS — Z23 Encounter for immunization: Secondary | ICD-10-CM | POA: Diagnosis not present

## 2024-02-09 NOTE — Progress Notes (Signed)
 Patient is in office today for a nurse visit for shingles Immunization. Patient Injection was given in the  Left deltoid. Patient tolerated injection well.

## 2024-03-06 DIAGNOSIS — Z01419 Encounter for gynecological examination (general) (routine) without abnormal findings: Secondary | ICD-10-CM | POA: Diagnosis not present

## 2024-03-06 DIAGNOSIS — Z1231 Encounter for screening mammogram for malignant neoplasm of breast: Secondary | ICD-10-CM | POA: Diagnosis not present

## 2024-03-06 DIAGNOSIS — Z124 Encounter for screening for malignant neoplasm of cervix: Secondary | ICD-10-CM | POA: Diagnosis not present

## 2024-03-07 ENCOUNTER — Other Ambulatory Visit (HOSPITAL_COMMUNITY): Payer: Self-pay

## 2024-03-07 MED ORDER — DESOGESTREL-ETHINYL ESTRADIOL 0.15-0.02/0.01 MG (21/5) PO TABS
1.0000 | ORAL_TABLET | Freq: Every day | ORAL | 4 refills | Status: AC
  Filled 2024-03-07 – 2024-04-15 (×2): qty 84, 84d supply, fill #0

## 2024-03-25 ENCOUNTER — Encounter: Payer: Self-pay | Admitting: Nurse Practitioner

## 2024-03-27 ENCOUNTER — Other Ambulatory Visit (HOSPITAL_COMMUNITY): Payer: Self-pay

## 2024-03-27 ENCOUNTER — Ambulatory Visit: Admitting: Nurse Practitioner

## 2024-03-27 ENCOUNTER — Encounter: Payer: Self-pay | Admitting: Nurse Practitioner

## 2024-03-27 DIAGNOSIS — R030 Elevated blood-pressure reading, without diagnosis of hypertension: Secondary | ICD-10-CM | POA: Diagnosis not present

## 2024-03-27 DIAGNOSIS — E66811 Obesity, class 1: Secondary | ICD-10-CM | POA: Diagnosis not present

## 2024-03-27 DIAGNOSIS — Z6831 Body mass index (BMI) 31.0-31.9, adult: Secondary | ICD-10-CM | POA: Diagnosis not present

## 2024-03-27 DIAGNOSIS — E6609 Other obesity due to excess calories: Secondary | ICD-10-CM | POA: Diagnosis not present

## 2024-03-27 MED ORDER — MAGNESIUM GLYCINATE 100 MG PO CAPS
1.0000 | ORAL_CAPSULE | Freq: Every evening | ORAL | 2 refills | Status: AC
  Filled 2024-03-27 – 2024-05-28 (×2): qty 60, 60d supply, fill #0

## 2024-03-27 NOTE — Progress Notes (Signed)
 LILLETTE Kristeen JINNY Gladis, CMA,acting as a neurosurgeon for Gaines Ada, FNP.,have documented all relevant documentation on the behalf of Gaines Ada, FNP,as directed by  Gaines Ada, FNP while in the presence of Gaines Ada, FNP.  Subjective:  Patient ID: Chelsea Stewart , female    DOB: 10-Dec-1970 , 53 y.o.   MRN: [id number]  Chief Complaint  Patient presents with   Hypertension    Patient presents today for elevated blood pressure reading from her apple watch. Patient reports she has also been checking at home and her blood pressure has been high. Patient reports she first got this notice Friday.     HPI  Discussed the use of AI scribe software for clinical note transcription with the patient, who gave verbal consent to proceed.  History of Present Illness Chelsea Stewart is a 53 year old female who presents with elevated blood pressure readings.  She has experienced elevated blood pressure readings ranging from 136/90 mmHg to 156/99 mmHg over the past week. Her smartwatch notified her of possible hypertension. She monitors her blood pressure at home and notes that the cuff squeezes tightly, which she wonders might affect the readings. No headaches, dizziness, or lightheadedness. No significant swelling in feet or ankles, although slight hand swelling occurs after treadmill exercise.  She reports experiencing stress from work and family. She reports trying to be mindful of her diet, though she finds it challenging during holidays like Thanksgiving. She tries to maintain a healthy lifestyle by walking for 30 minutes daily and being mindful of her diet, although she admits it is challenging during holidays like Thanksgiving.  She has a family history of hypertension, with both her mother and grandmother having had high blood pressure.  Regarding her sleep, she has difficulty staying asleep, waking up at 4 AM and struggling to return to sleep. She started taking 3 mg of melatonin about a week before  Thanksgiving, which has helped slightly. She typically goes to bed around 10 PM and wakes up at 5:45 AM.  She is currently on birth control pills and has not yet gone through menopause.  Past Medical History:  Diagnosis Date   Allergy    seasonal   Anxiety    Blood transfusion without reported diagnosis    1988   Hyperlipidemia      Family History  Problem Relation Age of Onset   Arthritis Mother    Hyperlipidemia Mother    Heart disease Mother 46       CABG--1st Dxed CAD   Hypertension Mother    Cancer Father    Parkinson's disease Father    Cancer Paternal Grandfather 32       Lung Cancer--Smoker   Cancer Paternal Aunt    Colon cancer Neg Hx    Colon polyps Neg Hx    Esophageal cancer Neg Hx    Stomach cancer Neg Hx    Rectal cancer Neg Hx      Current Outpatient Medications:    cetirizine (ZYRTEC) 10 MG tablet, Take 10 mg by mouth at bedtime., Disp: , Rfl:    desogestrel -ethinyl estradiol  (PIMTREA ) 0.15-0.02/0.01 MG (21/5) tablet, TAKE 1 TABLET BY MOUTH ONCE DAILY, Disp: 84 tablet, Rfl: 4   fluticasone  (FLONASE ) 50 MCG/ACT nasal spray, Place  2 sprays in each nostril daily., Disp: 16 g, Rfl: 11   ibuprofen  (ADVIL ) 400 MG tablet, Take 400 mg by mouth every 6 (six) hours as needed., Disp: , Rfl:    Magnesium  Glycinate 100 MG CAPS,  Take 1 capsule by mouth every evening., Disp: 60 capsule, Rfl: 2   losartan  (COZAAR ) 25 MG tablet, Take 1 tablet (25 mg total) by mouth daily., Disp: 30 tablet, Rfl: 1   Allergies  Allergen Reactions   Clindamycin Nausea And Vomiting   Erythromycin Nausea Only, Other (See Comments) and Nausea And Vomiting     Review of Systems  Constitutional: Negative.   Respiratory: Negative.    Cardiovascular: Negative.   Neurological: Negative.   Psychiatric/Behavioral: Negative.       Today's Vitals   03/27/24 1612 03/27/24 1637  BP: (!) 130/90 130/86  Pulse: 89   Temp: 98.4 F (36.9 C)   TempSrc: Oral   Weight: 188 lb (85.3 kg)    Height: 5' 5 (1.651 m)   PainSc: 0-No pain    Body mass index is 31.28 kg/m.  Wt Readings from Last 3 Encounters:  04/02/24 188 lb (85.3 kg)  03/27/24 188 lb (85.3 kg)  02/09/24 189 lb (85.7 kg)      Objective:  Physical Exam Vitals and nursing note reviewed.  Constitutional:      General: She is not in acute distress.    Appearance: Normal appearance.  Cardiovascular:     Rate and Rhythm: Normal rate and regular rhythm.     Pulses: Normal pulses.     Heart sounds: Normal heart sounds. No murmur heard. Neurological:     Mental Status: She is alert.      Assessment And Plan:   Assessment & Plan Elevated blood pressure reading Blood pressure 136/90 to 156/99. Possible factors: stress, diet, menopause. Family history of hypertension. Recent melatonin use. - Ordered thyroid and kidney function tests. - Initiated magnesium  glycinate in the evening. - Advised reduced salt intake, increased water to 64 oz daily. - Scheduled nurse visit for blood pressure check next week. - Instructed to maintain and send blood pressure log. - Consider antihypertensive if blood pressure =150. - Scheduled follow-up in 6-8 weeks. Class 1 obesity due to excess calories with body mass index (BMI) of 31.0 to 31.9 in adult, unspecified whether serious comorbidity present She is encouraged to strive for BMI less than 30 to decrease cardiac risk. Advised to aim for at least 150 minutes of exercise per week.    Orders Placed This Encounter  Procedures   TSH   Basic metabolic panel with GFR     Return for keep same next; 1 week NV BP check.  Patient was given opportunity to ask questions. Patient verbalized understanding of the plan and was able to repeat key elements of the plan. All questions were answered to their satisfaction.   LILLETTE Gaines Ada, FNP, have reviewed all documentation for this visit. The documentation on 03/27/2024 for the exam, diagnosis, procedures, and orders are all  accurate and complete.    IF YOU HAVE BEEN REFERRED TO A SPECIALIST, IT MAY TAKE 1-2 WEEKS TO SCHEDULE/PROCESS THE REFERRAL. IF YOU HAVE NOT HEARD FROM US /SPECIALIST IN TWO WEEKS, PLEASE GIVE US  A CALL AT [phone].

## 2024-03-28 ENCOUNTER — Other Ambulatory Visit (HOSPITAL_COMMUNITY): Payer: Self-pay

## 2024-03-28 LAB — TSH: TSH: 1.59 u[IU]/mL (ref 0.450–4.500)

## 2024-03-28 LAB — BASIC METABOLIC PANEL WITH GFR
BUN/Creatinine Ratio: 18 (ref 9–23)
BUN: 14 mg/dL (ref 6–24)
CO2: 18 mmol/L — ABNORMAL LOW (ref 20–29)
Calcium: 9.5 mg/dL (ref 8.7–10.2)
Chloride: 101 mmol/L (ref 96–106)
Creatinine, Ser: 0.79 mg/dL (ref 0.57–1.00)
Glucose: 103 mg/dL — ABNORMAL HIGH (ref 70–99)
Potassium: 4.5 mmol/L (ref 3.5–5.2)
Sodium: 135 mmol/L (ref 134–144)
eGFR: 89 mL/min/1.73 (ref >59)

## 2024-03-29 ENCOUNTER — Other Ambulatory Visit: Payer: Self-pay

## 2024-03-30 ENCOUNTER — Other Ambulatory Visit (HOSPITAL_COMMUNITY): Payer: Self-pay

## 2024-04-01 ENCOUNTER — Encounter (HOSPITAL_COMMUNITY): Payer: Self-pay

## 2024-04-01 ENCOUNTER — Other Ambulatory Visit (HOSPITAL_COMMUNITY): Payer: Self-pay

## 2024-04-02 ENCOUNTER — Other Ambulatory Visit (HOSPITAL_COMMUNITY): Payer: Self-pay

## 2024-04-02 ENCOUNTER — Ambulatory Visit

## 2024-04-02 VITALS — BP 130/70 | HR 83 | Temp 98.1°F | Ht 65.0 in | Wt 188.0 lb

## 2024-04-02 DIAGNOSIS — R030 Elevated blood-pressure reading, without diagnosis of hypertension: Secondary | ICD-10-CM

## 2024-04-02 MED ORDER — LOSARTAN POTASSIUM 25 MG PO TABS
25.0000 mg | ORAL_TABLET | Freq: Every day | ORAL | 1 refills | Status: DC
  Filled 2024-04-02 (×2): qty 30, 30d supply, fill #0
  Filled 2024-04-28: qty 30, 30d supply, fill #1

## 2024-04-02 NOTE — Progress Notes (Signed)
 Patient is in office today for a nurse visit for Blood Pressure Check. Patient currently taking magnesium  glycinate 200mg  (OTC) Patient blood pressure was 130/70, Patient No chest pain, No shortness of breath, No dyspnea on exertion, No orthopnea, No paroxysmal nocturnal dyspnea, No edema, No palpitations, No syncope BP Readings from Last 3 Encounters:  04/02/24 130/70  03/27/24 130/86  02/09/24 130/80   Per provider- Start losartan  25mg  once daily f/u in 4-6 weeks.

## 2024-04-07 ENCOUNTER — Ambulatory Visit: Payer: Self-pay | Admitting: Nurse Practitioner

## 2024-04-07 NOTE — Assessment & Plan Note (Addendum)
 She is encouraged to strive for BMI less than 30 to decrease cardiac risk. Advised to aim for at least 150 minutes of exercise per week.

## 2024-04-09 ENCOUNTER — Other Ambulatory Visit (HOSPITAL_COMMUNITY): Payer: Self-pay

## 2024-04-15 ENCOUNTER — Other Ambulatory Visit: Payer: Self-pay

## 2024-05-15 NOTE — Progress Notes (Unsigned)
 LILLETTE Kristeen JINNY Gladis, CMA,acting as a neurosurgeon for Gaines Ada, FNP.,have documented all relevant documentation on the behalf of Gaines Ada, FNP,as directed by  Gaines Ada, FNP while in the presence of Gaines Ada, FNP.  Subjective:  Patient ID: Chelsea Stewart , female    DOB: [birth year] , 54 y.o.   MRN: [id number]  No chief complaint on file.   HPI  HPI   Past Medical History:  Diagnosis Date   Allergy    seasonal   Anxiety    Blood transfusion without reported diagnosis    1988   Hyperlipidemia      Family History  Problem Relation Age of Onset   Arthritis Mother    Hyperlipidemia Mother    Heart disease Mother 61       CABG--1st Dxed CAD   Hypertension Mother    Cancer Father    Parkinson's disease Father    Cancer Paternal Grandfather 14       Lung Cancer--Smoker   Cancer Paternal Aunt    Colon cancer Neg Hx    Colon polyps Neg Hx    Esophageal cancer Neg Hx    Stomach cancer Neg Hx    Rectal cancer Neg Hx     Current Medications[1]   Allergies[2]   Review of Systems   There were no vitals filed for this visit. There is no height or weight on file to calculate BMI.  Wt Readings from Last 3 Encounters:  04/02/24 188 lb (85.3 kg)  03/27/24 188 lb (85.3 kg)  02/09/24 189 lb (85.7 kg)    The 10-year ASCVD risk score (Arnett DK, et al., 2019) is: 1.6%   Values used to calculate the score:     Age: 41 years     Clinically relevant sex: Female     Is Non-Hispanic African American: No     Diabetic: No     Tobacco smoker: No     Systolic Blood Pressure: 130 mmHg     Is BP treated: No     HDL Cholesterol: 51 mg/dL     Total Cholesterol: 182 mg/dL  Objective:  Physical Exam      Assessment And Plan:   Assessment & Plan   No orders of the defined types were placed in this encounter.    No follow-ups on file.  Patient was given opportunity to ask questions. Patient verbalized understanding of the plan and was able to repeat key elements of the plan.  All questions were answered to their satisfaction.    LILLETTE Gaines Ada, FNP, have reviewed all documentation for this visit. The documentation on 05/15/24 for the exam, diagnosis, procedures, and orders are all accurate and complete.   IF YOU HAVE BEEN REFERRED TO A SPECIALIST, IT MAY TAKE 1-2 WEEKS TO SCHEDULE/PROCESS THE REFERRAL. IF YOU HAVE NOT HEARD FROM US /SPECIALIST IN TWO WEEKS, PLEASE GIVE US  A CALL AT [phone] X [phone].     [1]  Current Outpatient Medications:    cetirizine (ZYRTEC) 10 MG tablet, Take 10 mg by mouth at bedtime., Disp: , Rfl:    desogestrel -ethinyl estradiol  (PIMTREA ) 0.15-0.02/0.01 MG (21/5) tablet, TAKE 1 TABLET BY MOUTH ONCE DAILY, Disp: 84 tablet, Rfl: 4   fluticasone  (FLONASE ) 50 MCG/ACT nasal spray, Place  2 sprays in each nostril daily., Disp: 16 g, Rfl: 11   ibuprofen  (ADVIL ) 400 MG tablet, Take 400 mg by mouth every 6 (six) hours as needed., Disp: , Rfl:    losartan  (COZAAR ) 25 MG tablet,  Take 1 tablet (25 mg total) by mouth daily., Disp: 30 tablet, Rfl: 1   Magnesium  Glycinate 100 MG CAPS, Take 1 capsule by mouth every evening., Disp: 60 capsule, Rfl: 2 [2]  Allergies Allergen Reactions   Clindamycin Nausea And Vomiting   Erythromycin Nausea Only, Other (See Comments) and Nausea And Vomiting

## 2024-05-16 ENCOUNTER — Ambulatory Visit: Payer: Self-pay | Admitting: Nurse Practitioner

## 2024-05-16 ENCOUNTER — Other Ambulatory Visit (HOSPITAL_COMMUNITY): Payer: Self-pay

## 2024-05-16 ENCOUNTER — Encounter: Payer: Self-pay | Admitting: Nurse Practitioner

## 2024-05-16 VITALS — BP 120/80 | HR 81 | Temp 98.5°F | Ht 65.0 in | Wt 190.0 lb

## 2024-05-16 DIAGNOSIS — E782 Mixed hyperlipidemia: Secondary | ICD-10-CM

## 2024-05-16 DIAGNOSIS — E6609 Other obesity due to excess calories: Secondary | ICD-10-CM

## 2024-05-16 DIAGNOSIS — Z6831 Body mass index (BMI) 31.0-31.9, adult: Secondary | ICD-10-CM

## 2024-05-16 DIAGNOSIS — I10 Essential (primary) hypertension: Secondary | ICD-10-CM | POA: Diagnosis not present

## 2024-05-16 DIAGNOSIS — E66811 Obesity, class 1: Secondary | ICD-10-CM

## 2024-05-16 LAB — LIPID PANEL
Chol/HDL Ratio: 3.9 ratio (ref 0.0–4.4)
Cholesterol, Total: 204 mg/dL — ABNORMAL HIGH (ref 100–199)
HDL: 52 mg/dL
LDL Chol Calc (NIH): 113 mg/dL — ABNORMAL HIGH (ref 0–99)
Triglycerides: 228 mg/dL — ABNORMAL HIGH (ref 0–149)
VLDL Cholesterol Cal: 39 mg/dL (ref 5–40)

## 2024-05-16 LAB — BMP8+EGFR
BUN/Creatinine Ratio: 19 (ref 9–23)
BUN: 13 mg/dL (ref 6–24)
CO2: 17 mmol/L — ABNORMAL LOW (ref 20–29)
Calcium: 9.4 mg/dL (ref 8.7–10.2)
Chloride: 105 mmol/L (ref 96–106)
Creatinine, Ser: 0.7 mg/dL (ref 0.57–1.00)
Glucose: 91 mg/dL (ref 70–99)
Potassium: 4.8 mmol/L (ref 3.5–5.2)
Sodium: 138 mmol/L (ref 134–144)
eGFR: 103 mL/min/1.73

## 2024-05-16 MED ORDER — LOSARTAN POTASSIUM 25 MG PO TABS
25.0000 mg | ORAL_TABLET | Freq: Every day | ORAL | 1 refills | Status: AC
  Filled 2024-05-16 – 2024-05-28 (×2): qty 90, 90d supply, fill #0

## 2024-05-16 NOTE — Assessment & Plan Note (Signed)
 Blood pressure controlled at 120/80 mmHg with losartan , she is doing well. We can re-evaluated in 6 months to see if can trial off the medication.  - Continue losartan  25 mg oral daily. - Encouraged to add five more minutes to daily exercise routine. - Advised low-salt diet. - Recheck blood pressure in July unless symptoms worsen.

## 2024-05-16 NOTE — Assessment & Plan Note (Signed)
 Cholesterol levels were improved with previous check.  Will check lipid panel

## 2024-05-16 NOTE — Assessment & Plan Note (Signed)
 Engaging in regular physical activity. Dietary modifications considered for weight loss. - Encouraged continuation of regular physical activity and to increase walking by 5 minutes to total 30 minutes. - Advised on dietary modifications for weight loss.

## 2024-05-27 ENCOUNTER — Ambulatory Visit: Payer: Self-pay | Admitting: Nurse Practitioner

## 2024-05-28 ENCOUNTER — Other Ambulatory Visit: Payer: Self-pay

## 2024-11-18 ENCOUNTER — Encounter: Payer: Self-pay | Admitting: Nurse Practitioner
